# Patient Record
Sex: Male | Born: 1970 | Race: White | Hispanic: No | Marital: Married | Smoking: Never smoker
Health system: Southern US, Community
[De-identification: ages and names within clinical notes are randomized; demographics above are authoritative.]

## PROBLEM LIST (undated history)

## (undated) DIAGNOSIS — L409 Psoriasis, unspecified: Secondary | ICD-10-CM

## (undated) DIAGNOSIS — E785 Hyperlipidemia, unspecified: Secondary | ICD-10-CM

## (undated) DIAGNOSIS — Z800 Family history of malignant neoplasm of digestive organs: Secondary | ICD-10-CM

## (undated) DIAGNOSIS — N390 Urinary tract infection, site not specified: Secondary | ICD-10-CM

## (undated) HISTORY — DX: Hyperlipidemia, unspecified: E78.5

## (undated) HISTORY — DX: Urinary tract infection, site not specified: N39.0

## (undated) HISTORY — PX: COLONOSCOPY: SHX174

## (undated) HISTORY — DX: Psoriasis, unspecified: L40.9

## (undated) HISTORY — DX: Family history of malignant neoplasm of digestive organs: Z80.0

## (undated) HISTORY — PX: WISDOM TOOTH EXTRACTION: SHX21

---

## 2003-04-29 ENCOUNTER — Observation Stay (HOSPITAL_COMMUNITY): Admission: EM | Admit: 2003-04-29 | Discharge: 2003-04-29 | Payer: Self-pay | Admitting: *Deleted

## 2003-05-01 ENCOUNTER — Ambulatory Visit (HOSPITAL_COMMUNITY): Admission: RE | Admit: 2003-05-01 | Discharge: 2003-05-01 | Payer: Self-pay | Admitting: Internal Medicine

## 2003-05-09 ENCOUNTER — Ambulatory Visit (HOSPITAL_COMMUNITY): Admission: RE | Admit: 2003-05-09 | Discharge: 2003-05-09 | Payer: Self-pay | Admitting: Cardiology

## 2003-10-06 ENCOUNTER — Encounter: Admission: RE | Admit: 2003-10-06 | Discharge: 2003-10-06 | Payer: Self-pay | Admitting: Internal Medicine

## 2007-10-20 ENCOUNTER — Ambulatory Visit: Payer: Self-pay | Admitting: Internal Medicine

## 2007-10-22 LAB — CONVERTED CEMR LAB
ALT: 34 units/L (ref 0–53)
AST: 27 units/L (ref 0–37)
Albumin: 4.1 g/dL (ref 3.5–5.2)
Alkaline Phosphatase: 47 units/L (ref 39–117)
BUN: 10 mg/dL (ref 6–23)
Basophils Absolute: 0 10*3/uL (ref 0.0–0.1)
Basophils Relative: 0.6 % (ref 0.0–1.0)
Bilirubin, Direct: 0.1 mg/dL (ref 0.0–0.3)
CO2: 32 meq/L (ref 19–32)
Calcium: 9.2 mg/dL (ref 8.4–10.5)
Chloride: 106 meq/L (ref 96–112)
Cholesterol: 222 mg/dL (ref 0–200)
Creatinine, Ser: 1 mg/dL (ref 0.4–1.5)
Direct LDL: 160.9 mg/dL
Eosinophils Absolute: 0.1 10*3/uL (ref 0.0–0.7)
Eosinophils Relative: 1.2 % (ref 0.0–5.0)
GFR calc Af Amer: 108 mL/min
GFR calc non Af Amer: 89 mL/min
Glucose, Bld: 92 mg/dL (ref 70–99)
HCT: 47.2 % (ref 39.0–52.0)
HDL: 33.1 mg/dL — ABNORMAL LOW (ref >39.0)
Hemoglobin: 16.1 g/dL (ref 13.0–17.0)
Lymphocytes Relative: 19.6 % (ref 12.0–46.0)
MCHC: 34.2 g/dL (ref 30.0–36.0)
MCV: 92.5 fL (ref 78.0–100.0)
Monocytes Absolute: 0.4 10*3/uL (ref 0.1–1.0)
Monocytes Relative: 7.3 % (ref 3.0–12.0)
Neutro Abs: 3.9 10*3/uL (ref 1.4–7.7)
Neutrophils Relative %: 71.3 % (ref 43.0–77.0)
Platelets: 221 10*3/uL (ref 150–400)
Potassium: 3.7 meq/L (ref 3.5–5.1)
RBC: 5.1 M/uL (ref 4.22–5.81)
RDW: 11.6 % (ref 11.5–14.6)
Sodium: 143 meq/L (ref 135–145)
TSH: 1.19 microintl units/mL (ref 0.35–5.50)
Total Bilirubin: 1 mg/dL (ref 0.3–1.2)
Total CHOL/HDL Ratio: 6.7
Total Protein: 6.9 g/dL (ref 6.0–8.3)
Triglycerides: 198 mg/dL — ABNORMAL HIGH (ref 0–149)
VLDL: 40 mg/dL (ref 0–40)
WBC: 5.5 10*3/uL (ref 4.5–10.5)

## 2007-10-26 ENCOUNTER — Telehealth (INDEPENDENT_AMBULATORY_CARE_PROVIDER_SITE_OTHER): Payer: Self-pay | Admitting: *Deleted

## 2007-11-15 ENCOUNTER — Ambulatory Visit: Payer: Self-pay | Admitting: Gastroenterology

## 2007-11-29 ENCOUNTER — Ambulatory Visit: Payer: Self-pay | Admitting: Gastroenterology

## 2007-11-29 ENCOUNTER — Encounter: Payer: Self-pay | Admitting: Gastroenterology

## 2007-11-29 LAB — HM COLONOSCOPY

## 2007-11-30 ENCOUNTER — Encounter: Payer: Self-pay | Admitting: Gastroenterology

## 2008-01-24 ENCOUNTER — Ambulatory Visit: Payer: Self-pay | Admitting: Internal Medicine

## 2008-01-24 ENCOUNTER — Telehealth: Payer: Self-pay | Admitting: Internal Medicine

## 2008-01-24 LAB — CONVERTED CEMR LAB
ALT: 34 U/L
AST: 31 U/L
Albumin: 3.9 g/dL
Alkaline Phosphatase: 52 U/L
Bilirubin, Direct: 0.1 mg/dL
Cholesterol: 199 mg/dL
HDL: 30.4 mg/dL — ABNORMAL LOW
LDL Cholesterol: 142 mg/dL — ABNORMAL HIGH
Total Bilirubin: 0.9 mg/dL
Total CHOL/HDL Ratio: 6.5
Total Protein: 6.4 g/dL
Triglycerides: 134 mg/dL
VLDL: 27 mg/dL

## 2008-01-31 ENCOUNTER — Ambulatory Visit: Payer: Self-pay | Admitting: Internal Medicine

## 2008-01-31 DIAGNOSIS — E785 Hyperlipidemia, unspecified: Secondary | ICD-10-CM

## 2008-03-20 ENCOUNTER — Ambulatory Visit: Payer: Self-pay | Admitting: Internal Medicine

## 2008-03-20 ENCOUNTER — Telehealth: Payer: Self-pay | Admitting: Internal Medicine

## 2008-03-21 LAB — CONVERTED CEMR LAB
Basophils Absolute: 0 10*3/uL (ref 0.0–0.1)
Basophils Relative: 0.2 % (ref 0.0–3.0)
Cholesterol: 159 mg/dL (ref 0–200)
Eosinophils Absolute: 0 10*3/uL (ref 0.0–0.7)
Eosinophils Relative: 1 % (ref 0.0–5.0)
HCT: 45.4 % (ref 39.0–52.0)
HDL: 29.2 mg/dL — ABNORMAL LOW (ref >39.0)
Hemoglobin: 15.9 g/dL (ref 13.0–17.0)
LDL Cholesterol: 106 mg/dL — ABNORMAL HIGH (ref 0–99)
Lymphocytes Relative: 21.1 % (ref 12.0–46.0)
MCHC: 34.9 g/dL (ref 30.0–36.0)
MCV: 93.6 fL (ref 78.0–100.0)
Monocytes Absolute: 0.3 10*3/uL (ref 0.1–1.0)
Monocytes Relative: 6.7 % (ref 3.0–12.0)
Neutro Abs: 3.1 10*3/uL (ref 1.4–7.7)
Neutrophils Relative %: 71 % (ref 43.0–77.0)
Platelets: 217 10*3/uL (ref 150–400)
RBC: 4.85 M/uL (ref 4.22–5.81)
RDW: 12 % (ref 11.5–14.6)
Total CHOL/HDL Ratio: 5.4
Triglycerides: 119 mg/dL (ref 0–149)
VLDL: 24 mg/dL (ref 0–40)
WBC: 4.3 10*3/uL — ABNORMAL LOW (ref 4.5–10.5)

## 2008-03-22 LAB — CONVERTED CEMR LAB
Amphetamine Screen, Ur: NEGATIVE
Barbiturate Quant, Ur: NEGATIVE
Benzodiazepines.: NEGATIVE
Cocaine Metabolites: NEGATIVE
Creatinine,U: 199.5 mg/dL
Ethyl Alcohol: <10 mg/dL (ref <10)
Marijuana Metabolite: NEGATIVE
Methadone: NEGATIVE
Opiate Screen, Urine: NEGATIVE
Phencyclidine (PCP): NEGATIVE
Propoxyphene: NEGATIVE

## 2010-07-06 ENCOUNTER — Encounter: Payer: Self-pay | Admitting: *Deleted

## 2010-09-24 ENCOUNTER — Other Ambulatory Visit: Payer: Self-pay

## 2010-09-30 ENCOUNTER — Other Ambulatory Visit (INDEPENDENT_AMBULATORY_CARE_PROVIDER_SITE_OTHER): Payer: Self-pay | Admitting: Internal Medicine

## 2010-09-30 DIAGNOSIS — Z0000 Encounter for general adult medical examination without abnormal findings: Secondary | ICD-10-CM

## 2010-09-30 LAB — CBC WITH DIFFERENTIAL/PLATELET
Basophils Absolute: 0 10*3/uL (ref 0.0–0.1)
Basophils Relative: 0.5 % (ref 0.0–3.0)
Eosinophils Absolute: 0 10*3/uL (ref 0.0–0.7)
Eosinophils Relative: 0.6 % (ref 0.0–5.0)
HCT: 46.1 % (ref 39.0–52.0)
Hemoglobin: 15.9 g/dL (ref 13.0–17.0)
Lymphocytes Relative: 19 % (ref 12.0–46.0)
Lymphs Abs: 1 10*3/uL (ref 0.7–4.0)
MCHC: 34.4 g/dL (ref 30.0–36.0)
MCV: 93.3 fl (ref 78.0–100.0)
Monocytes Absolute: 0.3 10*3/uL (ref 0.1–1.0)
Monocytes Relative: 6.4 % (ref 3.0–12.0)
Neutro Abs: 3.9 10*3/uL (ref 1.4–7.7)
Neutrophils Relative %: 73.5 % (ref 43.0–77.0)
Platelets: 219 10*3/uL (ref 150.0–400.0)
RBC: 4.94 Mil/uL (ref 4.22–5.81)
RDW: 12.5 % (ref 11.5–14.6)
WBC: 5.3 10*3/uL (ref 4.5–10.5)

## 2010-09-30 LAB — BASIC METABOLIC PANEL
BUN: 12 mg/dL (ref 6–23)
CO2: 29 mEq/L (ref 19–32)
Calcium: 9.2 mg/dL (ref 8.4–10.5)
Chloride: 102 mEq/L (ref 96–112)
Creatinine, Ser: 1.1 mg/dL (ref 0.4–1.5)
GFR: 82.35 mL/min (ref >60.00)
Glucose, Bld: 81 mg/dL (ref 70–99)
Potassium: 4 mEq/L (ref 3.5–5.1)
Sodium: 139 mEq/L (ref 135–145)

## 2010-09-30 LAB — LIPID PANEL
Cholesterol: 178 mg/dL (ref 0–200)
HDL: 40.9 mg/dL (ref >39.00)
LDL Cholesterol: 115 mg/dL — ABNORMAL HIGH (ref 0–99)
Total CHOL/HDL Ratio: 4
Triglycerides: 113 mg/dL (ref 0.0–149.0)
VLDL: 22.6 mg/dL (ref 0.0–40.0)

## 2010-09-30 LAB — POCT URINALYSIS DIPSTICK
Bilirubin, UA: NEGATIVE
Blood, UA: NEGATIVE
Glucose, UA: NEGATIVE
Ketones, UA: NEGATIVE
Leukocytes, UA: NEGATIVE
Nitrite, UA: NEGATIVE
Protein, UA: NEGATIVE
Spec Grav, UA: 1.025
Urobilinogen, UA: 0.2
pH, UA: 6.5

## 2010-09-30 LAB — HEPATIC FUNCTION PANEL
ALT: 26 U/L (ref 0–53)
AST: 21 U/L (ref 0–37)
Albumin: 3.9 g/dL (ref 3.5–5.2)
Alkaline Phosphatase: 48 U/L (ref 39–117)
Bilirubin, Direct: 0.1 mg/dL (ref 0.0–0.3)
Total Bilirubin: 0.8 mg/dL (ref 0.3–1.2)
Total Protein: 6.6 g/dL (ref 6.0–8.3)

## 2010-09-30 LAB — TSH: TSH: 0.74 u[IU]/mL (ref 0.35–5.50)

## 2010-10-04 ENCOUNTER — Encounter: Payer: Self-pay | Admitting: Internal Medicine

## 2010-10-07 ENCOUNTER — Ambulatory Visit (INDEPENDENT_AMBULATORY_CARE_PROVIDER_SITE_OTHER): Payer: 59 | Admitting: Internal Medicine

## 2010-10-07 ENCOUNTER — Encounter: Payer: Self-pay | Admitting: Internal Medicine

## 2010-10-07 VITALS — BP 122/80 | HR 84 | Temp 98.1°F | Ht 73.0 in | Wt 229.0 lb

## 2010-10-07 DIAGNOSIS — Z23 Encounter for immunization: Secondary | ICD-10-CM

## 2010-10-07 DIAGNOSIS — Z0000 Encounter for general adult medical examination without abnormal findings: Secondary | ICD-10-CM

## 2010-10-07 NOTE — Progress Notes (Signed)
  Subjective:    Patient ID: Cody Henderson, male    DOB: 03/12/1971, 40 y.o.   MRN: [id number]  HPI  cpx Past Medical History  Diagnosis Date  . Hyperlipidemia    No past surgical history on file.  reports that he has never smoked. He does not have any smokeless tobacco history on file. He reports that he drinks alcohol. His drug history not on file. family history includes Arthritis in his mother and Cancer in his maternal grandmother and mother. No Known Allergies    Review of Systems  patient denies chest pain, shortness of breath, orthopnea. Denies lower extremity edema, abdominal pain, change in appetite, change in bowel movements. Patient denies rashes, musculoskeletal complaints. No other specific complaints in a complete review of systems.      Objective:   Physical Exam  well-developed well-nourished male in no acute distress. HEENT exam atraumatic, normocephalic, neck supple without jugular venous distention. Chest clear to auscultation cardiac exam S1-S2 are regular. Abdominal exam overweight with bowel sounds, soft and nontender. Extremities no edema. Neurologic exam is alert with a normal gait.       Assessment & Plan:  Well visit Health maint utd escept will give tdap today Advised regular exercise and weight loss

## 2010-11-01 NOTE — Procedures (Signed)
NAME:  Cody Henderson, Cody Henderson                      ACCOUNT NO.:  [id number]   MEDICAL RECORD NO.:  [id number]                   PATIENT TYPE:  OUT   LOCATION:  RAD                                  FACILITY:  [facility]   PHYSICIAN:  Vida Roller, M.D.                DATE OF BIRTH:  1970/07/25   DATE OF PROCEDURE:  05/09/2003  DATE OF DISCHARGE:                                  ECHOCARDIOGRAM   TAPE NUMBER:  SE - 402.   TAPE COUNT:  2027 - 2204.   INDICATIONS FOR PROCEDURE:  This is a 40 year old male with chest pain.   DETAILS OF PROCEDURE:  In the resting state the patient had  echocardiographic images obtained of his heart in the apical 4, apical 2,  parasternal long and parasternal short axis views. These were kept in cine  loop format. The patient then exercised under Bruce protocol and when he had  attained at least 85% of his maximum predicted heart rate for his age, he  was immediately taken back to the echocardiographic suite where the same  images were obtained. These were used for comparison, stress versus rest.   RESULTS:  The patient exercised 10 minutes of a Bruce protocol, attaining 10  minutes of exercise. His heart rate increased from 64 beats per minute to  180 blood pressure, which is 97% of his maximum predicted heart rate for his  age. His blood pressure went from 126/78 to 192/90, giving dual product of  34.5. During that time, he had no symptoms. No arrhythmia. No STT wave  changes concerning for ischemia. There were no ischemic changes seen on the  electrocardiographic images in recovery as well.   ECHOCARDIOGRAPHIC IMAGES:  The baseline echocardiogram shows normal left  ventricular function with no evidence of wall motion abnormalities. Stress  images reveal appropriate augmentation of left ventricular systolic function  with no obvious stress induced wall motion abnormalities. Overall thickening  was normal throughout all myocardial segments.   INTERPRETATION:   This is an extremely low risk scan in a patient with no  known coronary artery disease. Clinical correlation is advised but the  likelihood of significant obstructive coronary disease is extremely low.      ___________________________________________                                            Vida Roller, M.D.   JH/MEDQ  D:  05/09/2003  T:  05/09/2003  Job:  [id number]

## 2010-11-01 NOTE — H&P (Signed)
NAME:  Cody Henderson, HOUSAND                      ACCOUNT NO.:  [id number]   MEDICAL RECORD NO.:  [id number]                   PATIENT TYPE:  OBV   LOCATION:  A222                                 FACILITY:  [facility]   PHYSICIAN:  Vania Rea, M.D.              DATE OF BIRTH:  [birth year]   DATE OF ADMISSION:  04/29/2003  DATE OF DISCHARGE:                                HISTORY & PHYSICAL   CHIEF COMPLAINT:  Sudden onset of chest pain and syncope early this morning.   HISTORY OF PRESENTING ILLNESS:  This is a healthy, Caucasian man with no  significant past medical history who has been under a lot of stress at work.  He had 2 episodes of driving, long distance, for 7 hours.  The first episode  5 days ago and then another 7-hour episode 2 days ago and he has not been  getting enough sleep.  The night before admission he fell asleep on the  couch and then woke at about midnight to go to bed. As he laid down he had  the sudden onset of left chest pain, squeezing in character radiating to the  left shoulder.  It was not relieved by stretching the chest and moving the  arms.  He got up and went to the bathroom where he was overcome with nausea,  sweating, and a tight cramping feeling in the left chest. He collapsed onto  the floor where he wife found him, making clawing movements on the floor and  apparently not responding to her commands.  There was no incontinence of  urine, no biting of his tongue.  He eventually recovered and was brought to  the emergency room.  There was associated shortness of breath, but there was  no fever and no vomiting.  On arrival in the emergency room the pain had  subsided spontaneously and he has been feeling completely normal since then.   PAST MEDICAL HISTORY:  His past medical history is completely negative.   ALLERGIES:  He has no known allergies.   MEDICATIONS:  He is on no medications at all.  No over-the-counter  medications.   SOCIAL HISTORY:  He  does not smoke, nor drink, nor take drugs.  He lives  with his wife.  He has 2 children ages 2 and 5.  There is no family history  of heart disease.  There is a remote family history of diabetes.   REVIEW OF SYSTEMS:  His review of systems is negative except for pain in the  left testicle on and off for the past 2 weeks and pain in the left groin on  and off for the past 2 weeks.   PHYSICAL EXAMINATION:  GENERAL:  He is a mildly obese, young, Caucasian man,  lying in bed, comfortable in no distress.  VITAL SIGNS:  Temperature 97.3, respirations 18, pulse 73, blood pressure  132/66.  HEENT:  Pink.  He is  anicteric.  Pupils are equal and reactive to light.  NECK:  There is no jugular venous distention.  CHEST:  The chest is clear to auscultation bilaterally.  CARDIOVASCULAR:  Regular rhythm, no murmurs.  ABDOMEN:  Obese, soft and nontender.  Normal abdominal bowel sounds.  EXTREMITIES:  No edema, 3+ pulses throughout.  There is no calf tenderness,  no groin tenderness.  GENITALIA:  Both right and left testicles are normal. There are no masses.  CENTRAL NERVOUS SYSTEM:  He is alert and oriented x3, no focal deficit.   Two sets of cardiac enzymes are completed.  CK/MB is 1.5, and the second one  was 1.5.  The troponin:  The first was 0.01, the second one less then 0.01.  Two EKGs are both normal with no evidence of ischemia.   ASSESSMENT:  Atypical chest pain, rule out acute anxiety attack.  In view of  the severe symptoms with syncope it is unlikely to be an myocardial  infarction with 2 sets of enzymes negative.  However, because of the history  of episodes of prolonged driving and sudden syncope we need to rule out a  deep venous thrombosis with pulmonary embolism.  So, the plan is to hydrate  him, do a Doppler ultrasound of his legs if available and a CT scan of his  chest.     ___________________________________________                                         Vania Rea,  M.D.   LC/MEDQ  D:  04/29/2003  T:  04/29/2003  Job:  [id number]

## 2010-11-01 NOTE — Procedures (Signed)
NAME:  Cody Henderson, Cody Henderson NO.:  [id number]   MEDICAL RECORD NO.:  [id number]                   PATIENT TYPE:  OUT   LOCATION:  RAD                                  FACILITY:  [facility]   PHYSICIAN:  Vida Roller, M.D.                DATE OF BIRTH:  1971-05-15   DATE OF PROCEDURE:  05/09/2003  DATE OF DISCHARGE:                                    STRESS TEST   INDICATION:  [name] is a 40 year old male with no past medical history  who presented with an episode of atypical chest discomfort and associated  syncope.  He was evaluated in the emergency room initially with negative  cardiac enzymes x1 and negative chest CT for pulmonary embolus.  His cardiac  risk factors include male sex and hypertriglyceridemia.   BASELINE DATA:  EKG reveals sinus rhythm at 66 beats per minute, nonspecific  ST abnormalities, blood pressure is 126/78.  Patient exercised for a total  of 9 minutes 52 seconds to Bruce protocol stage 4.  Maximum heart rate was  180 beats per minute which is 97% of predicted maximum.  Maximum blood  pressure is 192/90 which resolved down to 138/72 in recovery.  EKG revealed  no arrhythmias and no ischemic changes.  The patient denied any chest  discomfort or shortness of breath with exercise.  Test was stopped secondary  to fatigue.   Final results and stress images are pending MD review.     ________________________________________  ___________________________________________  Jae Dire, P.A. [facility]                      Vida Roller, M.D.   AB/MEDQ  D:  05/09/2003  T:  05/09/2003  Job:  [id number]

## 2010-11-01 NOTE — Discharge Summary (Signed)
   NAME:  Cody Henderson, Cody Henderson                      ACCOUNT NO.:  [id number]   MEDICAL RECORD NO.:  [id number]                   PATIENT TYPE:  OBV   LOCATION:  A222                                 FACILITY:  [facility]   PHYSICIAN:  Vania Rea, M.D.              DATE OF BIRTH:  [birth year]   DATE OF ADMISSION:  04/29/2003  DATE OF DISCHARGE:  04/29/2003                                 DISCHARGE SUMMARY   DISCHARGE DIAGNOSES:  1. Atypical chest pain, myocardial infarction ruled out.  2. Pulmonary embolism ruled out.  3. Transient stress disorder.   DISPOSITION:  Discharged to home.   CONDITION ON DISCHARGE:  Stable.   DISCHARGE MEDICATIONS:  Aspirin 81 mg daily.   HOSPITAL COURSE:  The patient was admitted with atypical chest pain for  observation. Please see history and physical of April 29, 2003. The  patient had acute squeezing chest pain and syncope. Came to the emergency  room. Two sets of cardiac enzymes were negative. EKG was persistently  normal. Because of the history of syncope, a CT scan of the chest was done,  which was also negative. Because the patient had a history of 2 episodes of  driving continuously for 7 hours, a deep vein thrombosis still cannot be  ruled out despite CT of the chest being negative. The patient has been  recommended for bilateral lower extremity Doppler's early next week.  However, the likelihood is that this is purely as a result of the stressful  situations that the patient has been undergoing for the past week. The  patient is nevertheless, advised to have bilateral lower extremity Doppler's  and a treadmill stress test as soon as possible. Because the patient was  subjective to a contrast study, the patient has been recommended to have a  chem 7 in 2 days and will present that to his primary care physician.   FOLLOW UP:  1. With Dr. Renard Matter, who has been selected as his primary care physician.     Dr. Renard Matter to followup with the  results of the chem 7 and bilateral lower extremity Doppler's.  2. The patient is also to followup with [facility] for a stress     test.   SPECIAL INSTRUCTIONS:  The patient is to return to the emergency room for  worsening chest pain or syncope.     ___________________________________________                                         Vania Rea, M.D.   LC/MEDQ  D:  04/29/2003  T:  04/29/2003  Job:  [id number]   cc:   Angus G. Renard Matter, M.D.  [address]  [place]  [place] [address]  Fax: [phone]

## 2012-03-09 ENCOUNTER — Encounter: Payer: Self-pay | Admitting: Internal Medicine

## 2012-03-09 ENCOUNTER — Ambulatory Visit (INDEPENDENT_AMBULATORY_CARE_PROVIDER_SITE_OTHER): Payer: 59 | Admitting: Internal Medicine

## 2012-03-09 VITALS — BP 116/82 | Temp 98.1°F | Wt 234.0 lb

## 2012-03-09 DIAGNOSIS — N390 Urinary tract infection, site not specified: Secondary | ICD-10-CM

## 2012-03-09 LAB — POCT URINALYSIS DIPSTICK
Glucose, UA: NEGATIVE
Nitrite, UA: NEGATIVE
Spec Grav, UA: >=1.03
Urobilinogen, UA: 1
pH, UA: 5.5

## 2012-03-09 MED ORDER — CIPROFLOXACIN HCL 500 MG PO TABS: 500.0000 mg | ORAL_TABLET | Freq: Two times a day (BID) | ORAL | Status: DC

## 2012-03-09 NOTE — Assessment & Plan Note (Signed)
41 year old white male with signs and symptoms of urinary tract infection. Rule out kidney stones as trigger for his symptoms. Treat with Cipro 500 mg twice a daily for 7 days. Obtain renal ultrasound.  Patient advised to call office if symptoms persist or worsen.

## 2012-03-09 NOTE — Progress Notes (Signed)
  Subjective:    Patient ID: Cody Henderson, male    DOB: [birth year], 41 y.o.   MRN: [id number]  HPI  41 year old white male complains of urinary urgency and frequency with low-grade fever over the last 48 hours.  His symptoms started on Sunday when he felt achy all over. Following day patient noticed urine was much darker and had an odor. He over last 24 hours he has felt feverish and tired. He reports previous history of prostatitis.  He denies history of BPH. Over last several days his urine stream is been somewhat weak.    Review of Systems Negative for history of kidney stones  Past Medical History  Diagnosis Date  . Hyperlipidemia     History   Social History  . Marital Status: Married    Spouse Name: N/A    Number of Children: N/A  . Years of Education: N/A   Occupational History  . buyer     [employer]   Social History Main Topics  . Smoking status: Never Smoker   . Smokeless tobacco: Not on file  . Alcohol Use: Yes  . Drug Use:   . Sexually Active:    Other Topics Concern  . Not on file   Social History Narrative  . No narrative on file    No past surgical history on file.  Family History  Problem Relation Age of Onset  . Arthritis Mother   . Cancer Mother     colon  . Cancer Maternal Grandmother     colon and cancer    No Known Allergies  No current outpatient prescriptions on file prior to visit.    BP 116/82  Temp 98.1 F (36.7 C) (Oral)  Wt 234 lb (106.142 kg)  Urinalysis positive for 3+ blood, 2+ protein and leukocytes     Objective:   Physical Exam  Constitutional: He is oriented to person, place, and time. He appears well-developed and well-nourished.  HENT:  Head: Normocephalic and atraumatic.  Right Ear: External ear normal.  Left Ear: External ear normal.  Mouth/Throat: Oropharynx is clear and moist.  Cardiovascular: Normal rate and regular rhythm.   Murmur heard. Pulmonary/Chest: Effort normal and breath sounds  normal.  Abdominal: Soft. Bowel sounds are normal. He exhibits no mass. There is no tenderness.       No flank tenderness  Genitourinary: Rectum normal and prostate normal. Guaiac negative stool.       Prostate gland is slightly enlarged but nontender. No asymmetry or prostate nodules  Neurological: He is alert and oriented to person, place, and time.  Psychiatric: He has a normal mood and affect. His behavior is normal.          Assessment & Plan:

## 2012-03-09 NOTE — Patient Instructions (Addendum)
Please complete the following lab tests before your next follow up appointment: BMET, LFTs, FLP, CBCD, TSH - V70

## 2012-03-12 ENCOUNTER — Ambulatory Visit
Admission: RE | Admit: 2012-03-12 | Discharge: 2012-03-12 | Disposition: A | Discharge status: Home / Self Care | Payer: 59 | Source: Ambulatory Visit | Attending: Internal Medicine | Admitting: Internal Medicine

## 2012-03-12 DIAGNOSIS — N390 Urinary tract infection, site not specified: Secondary | ICD-10-CM

## 2012-03-13 LAB — URINE CULTURE: Colony Count: >=100000

## 2012-03-26 ENCOUNTER — Other Ambulatory Visit (INDEPENDENT_AMBULATORY_CARE_PROVIDER_SITE_OTHER): Payer: 59

## 2012-03-26 DIAGNOSIS — N390 Urinary tract infection, site not specified: Secondary | ICD-10-CM

## 2012-03-26 LAB — POCT URINALYSIS DIPSTICK
Bilirubin, UA: NEGATIVE
Blood, UA: NEGATIVE
Glucose, UA: NEGATIVE
Ketones, UA: NEGATIVE
Leukocytes, UA: NEGATIVE
Nitrite, UA: NEGATIVE
Protein, UA: NEGATIVE
Spec Grav, UA: 1.025
Urobilinogen, UA: NEGATIVE
pH, UA: 7

## 2012-06-11 ENCOUNTER — Ambulatory Visit (INDEPENDENT_AMBULATORY_CARE_PROVIDER_SITE_OTHER): Payer: 59 | Admitting: Internal Medicine

## 2012-06-11 ENCOUNTER — Encounter: Payer: Self-pay | Admitting: Internal Medicine

## 2012-06-11 VITALS — BP 150/80 | HR 88 | Temp 98.3°F | Resp 18 | Wt 241.0 lb

## 2012-06-11 DIAGNOSIS — N390 Urinary tract infection, site not specified: Secondary | ICD-10-CM

## 2012-06-11 LAB — POCT URINALYSIS DIPSTICK
Bilirubin, UA: NEGATIVE
Glucose, UA: NEGATIVE
Ketones, UA: NEGATIVE
Nitrite, UA: POSITIVE
Protein, UA: 30
Spec Grav, UA: 1.025
Urobilinogen, UA: 0.2
pH, UA: 5

## 2012-06-11 MED ORDER — CIPROFLOXACIN HCL 500 MG PO TABS: 500.0000 mg | ORAL_TABLET | Freq: Two times a day (BID) | ORAL | Status: DC

## 2012-06-11 NOTE — Patient Instructions (Signed)
Drink as much fluid as you  can tolerate over the next few days  Take your antibiotic as prescribed until ALL of it is gone, but stop if you develop a rash, swelling, or any side effects of the medication.  Contact our office as soon as possible if  there are side effects of the medication.   

## 2012-06-11 NOTE — Progress Notes (Signed)
  Subjective:    Patient ID: Cody Henderson, male    DOB: [birth year], 41 y.o.   MRN: [id number]  HPI  41 year old patient who presents with a one-day history of urinary frequency some urgency low-grade fever and a general sense of unwellness. He was seen here 3 months ago and diagnosed and treated for a UTI. Urine culture revealed Escherichia coli which was pansensitive. His symptoms are the same. No recent antibiotic use.  Past Medical History  Diagnosis Date  . Hyperlipidemia     History   Social History  . Marital Status: Married    Spouse Name: N/A    Number of Children: N/A  . Years of Education: N/A   Occupational History  . buyer     [employer]   Social History Main Topics  . Smoking status: Never Smoker   . Smokeless tobacco: Not on file  . Alcohol Use: Yes  . Drug Use:   . Sexually Active:    Other Topics Concern  . Not on file   Social History Narrative  . No narrative on file    No past surgical history on file.  Family History  Problem Relation Age of Onset  . Arthritis Mother   . Cancer Mother     colon  . Cancer Maternal Grandmother     colon and cancer    No Known Allergies  Current Outpatient Prescriptions on File Prior to Visit  Medication Sig Dispense Refill  . ciprofloxacin (CIPRO) 500 MG tablet Take 1 tablet (500 mg total) by mouth 2 (two) times daily.  14 tablet  0    BP 150/80  Pulse 88  Temp 98.3 F (36.8 C) (Oral)  Resp 18  Wt 241 lb (109.317 kg)  SpO2 96%      Review of Systems  Constitutional: Negative for fever, chills, appetite change and fatigue.  HENT: Negative for hearing loss, ear pain, congestion, sore throat, trouble swallowing, neck stiffness, dental problem, voice change and tinnitus.   Eyes: Negative for pain, discharge and visual disturbance.  Respiratory: Negative for cough, chest tightness, wheezing and stridor.   Cardiovascular: Negative for chest pain, palpitations and leg swelling.    Gastrointestinal: Negative for nausea, vomiting, abdominal pain, diarrhea, constipation, blood in stool and abdominal distention.  Genitourinary: Positive for urgency and frequency. Negative for hematuria, flank pain, discharge, difficulty urinating and genital sores.  Musculoskeletal: Negative for myalgias, back pain, joint swelling, arthralgias and gait problem.  Skin: Negative for rash.  Neurological: Negative for dizziness, syncope, speech difficulty, weakness, numbness and headaches.  Hematological: Negative for adenopathy. Does not bruise/bleed easily.  Psychiatric/Behavioral: Negative for behavioral problems and dysphoric mood. The patient is not nervous/anxious.        Objective:   Physical Exam  Constitutional: He appears well-developed and well-nourished. No distress.          Assessment & Plan:   Recurrent urinary tract infection. Patient had a normal renal ultrasound 3 months ago. We'll consider urological referral if he has anymore recurrent UTIs 3 Will retreat with Cipro for 7 days.

## 2012-11-23 ENCOUNTER — Encounter: Payer: Self-pay | Admitting: Gastroenterology

## 2013-05-02 ENCOUNTER — Encounter: Payer: Self-pay | Admitting: Gastroenterology

## 2013-08-31 ENCOUNTER — Encounter: Payer: Self-pay | Admitting: Gastroenterology

## 2013-10-17 ENCOUNTER — Encounter: Payer: 59 | Admitting: Gastroenterology

## 2013-12-13 ENCOUNTER — Ambulatory Visit (AMBULATORY_SURGERY_CENTER): Payer: Self-pay | Admitting: *Deleted

## 2013-12-13 VITALS — Ht 73.0 in | Wt 236.2 lb

## 2013-12-13 DIAGNOSIS — Z86010 Personal history of colonic polyps: Secondary | ICD-10-CM

## 2013-12-13 MED ORDER — MOVIPREP 100 G PO SOLR: ORAL | Status: DC

## 2013-12-13 NOTE — Progress Notes (Signed)
No allergies to eggs or soy. No problems with sedation.  Pt given Emmi instructions for colonoscopy  No oxygen use  No diet drug use

## 2013-12-15 ENCOUNTER — Encounter: Payer: Self-pay | Admitting: Gastroenterology

## 2013-12-27 ENCOUNTER — Encounter: Payer: Self-pay | Admitting: Gastroenterology

## 2013-12-27 ENCOUNTER — Ambulatory Visit (AMBULATORY_SURGERY_CENTER): Payer: 59 | Admitting: Gastroenterology

## 2013-12-27 VITALS — BP 110/65 | HR 64 | Temp 97.2°F | Resp 18 | Ht 73.0 in | Wt 236.0 lb

## 2013-12-27 DIAGNOSIS — D126 Benign neoplasm of colon, unspecified: Secondary | ICD-10-CM

## 2013-12-27 DIAGNOSIS — Z1211 Encounter for screening for malignant neoplasm of colon: Secondary | ICD-10-CM

## 2013-12-27 DIAGNOSIS — Z86010 Personal history of colonic polyps: Secondary | ICD-10-CM

## 2013-12-27 DIAGNOSIS — Z800 Family history of malignant neoplasm of digestive organs: Secondary | ICD-10-CM

## 2013-12-27 MED ORDER — SODIUM CHLORIDE 0.9 % IV SOLN: 500.0000 mL | INTRAVENOUS | Status: DC

## 2013-12-27 NOTE — Op Note (Signed)
[facility]  [address]. [place], [address]   COLONOSCOPY PROCEDURE REPORT  PATIENT: Cody, Henderson  MR#: [id number] BIRTHDATE: 1970-09-03 , 43  yrs. old GENDER: Male ENDOSCOPIST: Milus Banister, MD PROCEDURE DATE:  12/27/2013 PROCEDURE:   Colonoscopy with snare polypectomy First Screening Colonoscopy - Avg.  risk and is 50 yrs.  old or older - No.  Prior Negative Screening - Now for repeat screening. Above average risk  History of Adenoma - Now for follow-up colonoscopy & has been > or = to 3 yrs.  N/A  Polyps Removed Today? Yes. ASA CLASS:   Class II INDICATIONS:elevated risk screening; mother had colon cancer in her 64's; colonoscopy 2009 found hyperplastic polyps only MEDICATIONS: MAC sedation, administered by CRNA and Propofol (Diprivan) 240 mg IV  DESCRIPTION OF PROCEDURE:   After the risks benefits and alternatives of the procedure were thoroughly explained, informed consent was obtained.  A digital rectal exam revealed no abnormalities of the rectum.   The LB [id number]  endoscope was introduced through the anus and advanced to the cecum, which was identified by both the appendix and ileocecal valve. No adverse events experienced.   The quality of the prep was excellent.  The instrument was then slowly withdrawn as the colon was fully examined.   COLON FINDINGS: One polyp was found, removed and sent to pathology. This was sessile, 77mm across, located in sigmoid segment, removed with cold snare.  The examination was otherwise normal. Retroflexed views revealed no abnormalities. The time to cecum=1 minutes 13 seconds.  Withdrawal time=6 minutes 33 seconds.  The scope was withdrawn and the procedure completed. COMPLICATIONS: There were no complications.  ENDOSCOPIC IMPRESSION: One polyp was found, removed and sent to pathology. The examination was otherwise normal.  RECOMMENDATIONS: Given your significant family history of colon  cancer, you should have a repeat colonoscopy in 5 years even if the polyp removed today is NOT precancerous.  You will receive a letter within 1-2 weeks with the results of your biopsy as well as final recommendations.  Please call my office if you have not received a letter after 3 weeks.   eSigned:  Milus Banister, MD 12/27/2013 8:17 AM   cc:  Phoebe Sharps, MD

## 2013-12-27 NOTE — Progress Notes (Signed)
Called to room to assist during endoscopic procedure.  Patient ID and intended procedure confirmed with present staff. Received instructions for my participation in the procedure from the performing physician.  

## 2013-12-27 NOTE — Progress Notes (Signed)
Report to PACU, RN, vss, BBS= Clear.  

## 2013-12-27 NOTE — Patient Instructions (Signed)
Discharge instructions given with verbal understanding. Handout on polyps. Resume previous medications. YOU HAD AN ENDOSCOPIC PROCEDURE TODAY AT THE [facility]: Refer to the procedure report that was given to you for any specific questions about what was found during the examination.  If the procedure report does not answer your questions, please call your gastroenterologist to clarify.  If you requested that your care partner not be given the details of your procedure findings, then the procedure report has been included in a sealed envelope for you to review at your convenience later.  YOU SHOULD EXPECT: Some feelings of bloating in the abdomen. Passage of more gas than usual.  Walking can help get rid of the air that was put into your GI tract during the procedure and reduce the bloating. If you had a lower endoscopy (such as a colonoscopy or flexible sigmoidoscopy) you may notice spotting of blood in your stool or on the toilet paper. If you underwent a bowel prep for your procedure, then you may not have a normal bowel movement for a few days.  DIET: Your first meal following the procedure should be a light meal and then it is ok to progress to your normal diet.  A half-sandwich or bowl of soup is an example of a good first meal.  Heavy or fried foods are harder to digest and may make you feel nauseous or bloated.  Likewise meals heavy in dairy and vegetables can cause extra gas to form and this can also increase the bloating.  Drink plenty of fluids but you should avoid alcoholic beverages for 24 hours.  ACTIVITY: Your care partner should take you home directly after the procedure.  You should plan to take it easy, moving slowly for the rest of the day.  You can resume normal activity the day after the procedure however you should NOT DRIVE or use heavy machinery for 24 hours (because of the sedation medicines used during the test).    SYMPTOMS TO REPORT IMMEDIATELY: A  gastroenterologist can be reached at any hour.  During normal business hours, 8:30 AM to 5:00 PM Monday through Friday, call [phone].  After hours and on weekends, please call the GI answering service at [phone] who will take a message and have the physician on call contact you.   Following lower endoscopy (colonoscopy or flexible sigmoidoscopy):  Excessive amounts of blood in the stool  Significant tenderness or worsening of abdominal pains  Swelling of the abdomen that is new, acute  Fever of 100F or higher  FOLLOW UP: If any biopsies were taken you will be contacted by phone or by letter within the next 1-3 weeks.  Call your gastroenterologist if you have not heard about the biopsies in 3 weeks.  Our staff will call the home number listed on your records the next business day following your procedure to check on you and address any questions or concerns that you may have at that time regarding the information given to you following your procedure. This is a courtesy call and so if there is no answer at the home number and we have not heard from you through the emergency physician on call, we will assume that you have returned to your regular daily activities without incident.  SIGNATURES/CONFIDENTIALITY: You and/or your care partner have signed paperwork which will be entered into your electronic medical record.  These signatures attest to the fact that that the information above on your After Visit Summary has been   reviewed and is understood.  Full responsibility of the confidentiality of this discharge information lies with you and/or your care-partner. 

## 2013-12-28 ENCOUNTER — Telehealth: Payer: Self-pay | Admitting: *Deleted

## 2013-12-28 NOTE — Telephone Encounter (Signed)
Left message that we called for f/u 

## 2014-01-09 ENCOUNTER — Encounter: Payer: Self-pay | Admitting: Gastroenterology

## 2014-02-08 ENCOUNTER — Encounter: Payer: Self-pay | Admitting: Gastroenterology

## 2014-11-20 ENCOUNTER — Encounter: Payer: Self-pay | Admitting: Internal Medicine

## 2014-11-20 ENCOUNTER — Ambulatory Visit (INDEPENDENT_AMBULATORY_CARE_PROVIDER_SITE_OTHER): Payer: 59 | Admitting: Internal Medicine

## 2014-11-20 VITALS — BP 120/76 | HR 65 | Temp 98.5°F | Resp 20 | Ht 73.0 in | Wt 238.0 lb

## 2014-11-20 DIAGNOSIS — R350 Frequency of micturition: Secondary | ICD-10-CM

## 2014-11-20 DIAGNOSIS — R319 Hematuria, unspecified: Secondary | ICD-10-CM | POA: Diagnosis not present

## 2014-11-20 MED ORDER — CIPROFLOXACIN HCL 500 MG PO TABS: 500.0000 mg | ORAL_TABLET | Freq: Two times a day (BID) | ORAL | Status: DC

## 2014-11-20 NOTE — Progress Notes (Signed)
Pre visit review using our clinic review tool, if applicable. No additional management support is needed unless otherwise documented below in the visit note. 

## 2014-11-20 NOTE — Patient Instructions (Signed)
Drink as much fluid as you  can tolerate over the next few days  Take your antibiotic as prescribed until ALL of it is gone, but stop if you develop a rash, swelling, or any side effects of the medication.  Contact our office as soon as possible if  there are side effects of the medication.   

## 2014-11-20 NOTE — Progress Notes (Signed)
   Subjective:    Patient ID: Cody Henderson, male    DOB: [birth year], 44 y.o.   MRN: [id number]  HPI 44 year old patient who has had a history of urinary tract infections.  For the past few days she has had some urinary frequency, urgency and a single episode of hematuria.  He has been referred to urology in the past.  He has had no recent infections. He did have a colonoscopy done last year due to strong family history of colon cancer  Past Medical History  Diagnosis Date  . Hyperlipidemia     History   Social History  . Marital Status: Married    Spouse Name: N/A  . Number of Children: N/A  . Years of Education: N/A   Occupational History  . buyer     [employer]   Social History Main Topics  . Smoking status: Never Smoker   . Smokeless tobacco: Never Used  . Alcohol Use: Yes     Comment: rare  . Drug Use: No  . Sexual Activity: Not on file   Other Topics Concern  . Not on file   Social History Narrative    Past Surgical History  Procedure Laterality Date  . Wisdom tooth extraction      Family History  Problem Relation Age of Onset  . Arthritis Mother   . Cancer Mother     colon  . Colon cancer Mother 60  . Cancer Maternal Grandmother     colon and cancer  . Colon cancer Maternal Grandmother     not sure age of onset    No Known Allergies  No current outpatient prescriptions on file prior to visit.   No current facility-administered medications on file prior to visit.    BP 120/76 mmHg  Pulse 65  Temp(Src) 98.5 F (36.9 C) (Oral)  Resp 20  Ht 6\' 1"  (1.854 m)  Wt 238 lb (107.956 kg)  BMI 31.41 kg/m2  SpO2 97%      Review of Systems  Constitutional: Negative for fever, chills, appetite change and fatigue.  HENT: Negative for congestion, dental problem, ear pain, hearing loss, sore throat, tinnitus, trouble swallowing and voice change.   Eyes: Negative for pain, discharge and visual disturbance.  Respiratory: Negative for cough,  chest tightness, wheezing and stridor.   Cardiovascular: Negative for chest pain, palpitations and leg swelling.  Gastrointestinal: Negative for nausea, vomiting, abdominal pain, diarrhea, constipation, blood in stool and abdominal distention.  Genitourinary: Positive for dysuria, urgency, frequency and difficulty urinating. Negative for hematuria, flank pain, decreased urine volume, discharge, scrotal swelling, genital sores and testicular pain.  Musculoskeletal: Negative for myalgias, back pain, joint swelling, arthralgias, gait problem and neck stiffness.  Skin: Negative for rash.  Neurological: Negative for dizziness, syncope, speech difficulty, weakness, numbness and headaches.  Hematological: Negative for adenopathy. Does not bruise/bleed easily.  Psychiatric/Behavioral: Negative for behavioral problems and dysphoric mood. The patient is not nervous/anxious.        Objective:   Physical Exam  Constitutional: He appears well-developed and well-nourished. No distress.          Assessment & Plan:   Urinary tract infection.  Will treat with Cipro. Patient has had urological evaluation.  Unclear whether he has had cystoscopy, but has had a ultrasound.  If recurrent symptoms or hematuria.  Will refer  Strong family history colon cancer, status post colonoscopy July 2015 Suggest annual exam at his convenience

## 2015-06-17 DIAGNOSIS — Z87442 Personal history of urinary calculi: Secondary | ICD-10-CM

## 2015-06-17 HISTORY — DX: Personal history of urinary calculi: Z87.442

## 2015-07-13 ENCOUNTER — Emergency Department (HOSPITAL_COMMUNITY): Payer: 59

## 2015-07-13 ENCOUNTER — Encounter (HOSPITAL_COMMUNITY): Payer: Self-pay

## 2015-07-13 ENCOUNTER — Emergency Department (HOSPITAL_COMMUNITY)
Admission: EM | Admit: 2015-07-13 | Discharge: 2015-07-13 | Disposition: A | Discharge status: Home / Self Care | Payer: 59 | Attending: Emergency Medicine | Admitting: Emergency Medicine

## 2015-07-13 DIAGNOSIS — R61 Generalized hyperhidrosis: Secondary | ICD-10-CM | POA: Diagnosis not present

## 2015-07-13 DIAGNOSIS — R109 Unspecified abdominal pain: Secondary | ICD-10-CM

## 2015-07-13 DIAGNOSIS — Z8639 Personal history of other endocrine, nutritional and metabolic disease: Secondary | ICD-10-CM | POA: Insufficient documentation

## 2015-07-13 DIAGNOSIS — N201 Calculus of ureter: Secondary | ICD-10-CM

## 2015-07-13 LAB — BASIC METABOLIC PANEL
Anion gap: 9 (ref 5–15)
BUN: 12 mg/dL (ref 6–20)
CALCIUM: 9.2 mg/dL (ref 8.9–10.3)
CO2: 28 mmol/L (ref 22–32)
CREATININE: 1.25 mg/dL — AB (ref 0.61–1.24)
Chloride: 106 mmol/L (ref 101–111)
GFR calc Af Amer: >60 mL/min (ref >60)
GLUCOSE: 137 mg/dL — AB (ref 65–99)
Potassium: 3.8 mmol/L (ref 3.5–5.1)
SODIUM: 143 mmol/L (ref 135–145)

## 2015-07-13 LAB — URINALYSIS, ROUTINE W REFLEX MICROSCOPIC
BILIRUBIN URINE: NEGATIVE
GLUCOSE, UA: NEGATIVE mg/dL
HGB URINE DIPSTICK: NEGATIVE
Ketones, ur: NEGATIVE mg/dL
Leukocytes, UA: NEGATIVE
Nitrite: NEGATIVE
SPECIFIC GRAVITY, URINE: 1.025 (ref 1.005–1.030)
pH: 6 (ref 5.0–8.0)

## 2015-07-13 LAB — URINE MICROSCOPIC-ADD ON
BACTERIA UA: NONE SEEN
SQUAMOUS EPITHELIAL / LPF: NONE SEEN
WBC, UA: NONE SEEN WBC/hpf (ref 0–5)

## 2015-07-13 LAB — CBC WITH DIFFERENTIAL/PLATELET
Basophils Absolute: 0 10*3/uL (ref 0.0–0.1)
Basophils Relative: 0 %
EOS ABS: 0 10*3/uL (ref 0.0–0.7)
EOS PCT: 0 %
HCT: 46.2 % (ref 39.0–52.0)
Hemoglobin: 15.9 g/dL (ref 13.0–17.0)
LYMPHS ABS: 1.1 10*3/uL (ref 0.7–4.0)
LYMPHS PCT: 11 %
MCH: 31.6 pg (ref 26.0–34.0)
MCHC: 34.4 g/dL (ref 30.0–36.0)
MCV: 91.8 fL (ref 78.0–100.0)
MONO ABS: 0.5 10*3/uL (ref 0.1–1.0)
Monocytes Relative: 5 %
Neutro Abs: 8.5 10*3/uL — ABNORMAL HIGH (ref 1.7–7.7)
Neutrophils Relative %: 84 %
PLATELETS: 250 10*3/uL (ref 150–400)
RBC: 5.03 MIL/uL (ref 4.22–5.81)
RDW: 12.3 % (ref 11.5–15.5)
WBC: 10.2 10*3/uL (ref 4.0–10.5)

## 2015-07-13 MED ORDER — ONDANSETRON HCL 4 MG/2ML IJ SOLN
4.0000 mg | Freq: Once | INTRAMUSCULAR | Status: AC
  Administered 2015-07-13: 4 mg via INTRAVENOUS

## 2015-07-13 MED ORDER — ONDANSETRON HCL 4 MG/2ML IJ SOLN
INTRAMUSCULAR | Status: AC
  Administered 2015-07-13: 4 mg via INTRAVENOUS
  Filled 2015-07-13: qty 2

## 2015-07-13 MED ORDER — FENTANYL CITRATE (PF) 100 MCG/2ML IJ SOLN
50.0000 ug | Freq: Once | INTRAMUSCULAR | Status: AC
  Administered 2015-07-13: 50 ug via INTRAVENOUS

## 2015-07-13 MED ORDER — FENTANYL CITRATE (PF) 100 MCG/2ML IJ SOLN
INTRAMUSCULAR | Status: AC
  Administered 2015-07-13: 50 ug via INTRAVENOUS
  Filled 2015-07-13: qty 2

## 2015-07-13 MED ORDER — HYDROMORPHONE HCL 1 MG/ML IJ SOLN
1.0000 mg | Freq: Once | INTRAMUSCULAR | Status: AC
  Administered 2015-07-13: 1 mg via INTRAVENOUS
  Filled 2015-07-13: qty 1

## 2015-07-13 MED ORDER — OXYCODONE-ACETAMINOPHEN 5-325 MG PO TABS: 1.0000 | ORAL_TABLET | Freq: Four times a day (QID) | ORAL | Status: DC | PRN

## 2015-07-13 MED ORDER — KETOROLAC TROMETHAMINE 30 MG/ML IJ SOLN
30.0000 mg | Freq: Once | INTRAMUSCULAR | Status: AC
  Administered 2015-07-13: 30 mg via INTRAVENOUS
  Filled 2015-07-13: qty 1

## 2015-07-13 NOTE — ED Provider Notes (Signed)
CSN: [id number]     Arrival date & time 07/13/15  0905 History  By signing my name below, I, Hilda Lias, attest that this documentation has been prepared under the direction and in the presence of Davonna Belling, MD. Electronically Signed: Hilda Lias, ED Scribe. 07/13/2015. 9:29 AM.    Chief Complaint  Patient presents with  . Flank Pain      Patient is a 45 y.o. male presenting with flank pain. The history is provided by the patient. No language interpreter was used.  Flank Pain   HPI Comments: Cody Henderson is a 45 y.o. male who presents to the Emergency Department complaining of constant, worsening right flank pain that radiates to the right side of the abdomen that has been present since about an hour ago. Pt reports the pain came on suddenly, and he became diaphoretic and nauseous as well. Pt states he has never had kidney stones before. Pt denies having surgery on abdomen or appendicitis. Pt denies dysuria, trouble urinating, urgency, or any other urinary symptoms.    Past Medical History  Diagnosis Date  . Hyperlipidemia    Past Surgical History  Procedure Laterality Date  . Wisdom tooth extraction     Family History  Problem Relation Age of Onset  . Arthritis Mother   . Cancer Mother     colon  . Colon cancer Mother 29  . Cancer Maternal Grandmother     colon and cancer  . Colon cancer Maternal Grandmother     not sure age of onset   Social History  Substance Use Topics  . Smoking status: Never Smoker   . Smokeless tobacco: Never Used  . Alcohol Use: Yes     Comment: rare    Review of Systems  Constitutional: Positive for diaphoresis.  Gastrointestinal: Negative for nausea.  Genitourinary: Positive for flank pain. Negative for dysuria, urgency, frequency, hematuria and difficulty urinating.  All other systems reviewed and are negative.     Allergies  Review of patient's allergies indicates no known allergies.  Home Medications   Prior  to Admission medications   Medication Sig Start Date End Date Taking? Authorizing Provider  ciprofloxacin (CIPRO) 500 MG tablet Take 1 tablet (500 mg total) by mouth 2 (two) times daily. Patient not taking: Reported on 07/13/2015 11/20/14   Marletta Lor, MD  oxyCODONE-acetaminophen (PERCOCET/ROXICET) 5-325 MG tablet Take 1-2 tablets by mouth every 6 (six) hours as needed for severe pain. 07/13/15   Davonna Belling, MD   BP 136/68 mmHg  Pulse 60  Temp(Src)   Resp 18  Ht 6\' 1"  (1.854 m)  Wt 235 lb (106.595 kg)  BMI 31.01 kg/m2  SpO2 100% Physical Exam  Constitutional: He appears well-developed and well-nourished.  Appears uncomfortable  Pulmonary/Chest: Effort normal and breath sounds normal.  Abdominal: There is tenderness.  Mild right abdominal tenderness Mild epigastric tenderness  Musculoskeletal: He exhibits tenderness. He exhibits no edema.  Right CVA tenderness No leg swelling  Skin: He is not diaphoretic. There is pallor.    ED Course  Procedures (including critical care time)  DIAGNOSTIC STUDIES: Oxygen Saturation is 100% on room air, normal by my interpretation.    COORDINATION OF CARE: 9:25 AM Discussed treatment plan with pt at bedside and pt agreed to plan.   Labs Review Labs Reviewed  URINALYSIS, ROUTINE W REFLEX MICROSCOPIC (NOT AT [facility]) - Abnormal; Notable for the following:    Protein, ur TRACE (*)    All other components within normal  limits  CBC WITH DIFFERENTIAL/PLATELET - Abnormal; Notable for the following:    Neutro Abs 8.5 (*)    All other components within normal limits  BASIC METABOLIC PANEL - Abnormal; Notable for the following:    Glucose, Bld 137 (*)    Creatinine, Ser 1.25 (*)    All other components within normal limits  URINE MICROSCOPIC-ADD ON    Imaging Review Ct Renal Stone Study  07/13/2015  CLINICAL DATA:  Right flank pain for 2 hours radiating to the mid abdomen. EXAM: CT ABDOMEN AND PELVIS WITHOUT CONTRAST TECHNIQUE:  Multidetector CT imaging of the abdomen and pelvis was performed following the standard protocol without IV contrast. COMPARISON:  None. FINDINGS: Lower chest:  Lung bases are clear.  Normal heart size. Hepatobiliary: Diffuse low attenuation of the liver as can be seen with hepatic steatosis. No hepatic mass. Normal gallbladder. Pancreas: Normal. Spleen: Normal. Adrenals/Urinary Tract: Normal adrenal glands. No renal mass. 4 mm right UVJ calculus. No obstructive uropathy. Decompressed bladder. Stomach/Bowel: No bowel wall thickening or dilatation. No pneumatosis, pneumoperitoneum portal venous gas. Normal appendix. No abdominal or pelvic free fluid. Vascular/Lymphatic: Normal caliber abdominal aorta. No lymphadenopathy. Other: No fluid collection or hematoma. Musculoskeletal: No acute osseous abnormality. No lytic or sclerotic osseous lesion. 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 IMPRESSION: 1.  4 mm right UVJ calculus without obstructive uropathy. 2. Hepatic steatosis. Electronically Signed   By: Kathreen Devoid   On: 07/13/2015 11:11   I have personally reviewed and evaluated these images and lab results as part of my medical decision-making.    MDM   Final diagnoses:  Right flank pain  Right ureteral stone    Patient with flank pain. Has UVJ stone without hydronephrosis. Feels better after treatment will be discharged home follow-up with urology.  I personally performed the services described in this documentation, which was scribed in my presence. The recorded information has been reviewed and is accurate.      Davonna Belling, MD 07/13/15 1536

## 2015-07-13 NOTE — Discharge Instructions (Signed)
Kidney Stones °Kidney stones (urolithiasis) are deposits that form inside your kidneys. The intense pain is caused by the stone moving through the urinary tract. When the stone moves, the ureter goes into spasm around the stone. The stone is usually passed in the urine.  °CAUSES  °· A disorder that makes certain neck glands produce too much parathyroid hormone (primary hyperparathyroidism). °· A buildup of uric acid crystals, similar to gout in your joints. °· Narrowing (stricture) of the ureter. °· A kidney obstruction present at birth (congenital obstruction). °· Previous surgery on the kidney or ureters. °· Numerous kidney infections. °SYMPTOMS  °· Feeling sick to your stomach (nauseous). °· Throwing up (vomiting). °· Blood in the urine (hematuria). °· Pain that usually spreads (radiates) to the groin. °· Frequency or urgency of urination. °DIAGNOSIS  °· Taking a history and physical exam. °· Blood or urine tests. °· CT scan. °· Occasionally, an examination of the inside of the urinary bladder (cystoscopy) is performed. °TREATMENT  °· Observation. °· Increasing your fluid intake. °· Extracorporeal shock wave lithotripsy--This is a noninvasive procedure that uses shock waves to break up kidney stones. °· Surgery may be needed if you have severe pain or persistent obstruction. There are various surgical procedures. Most of the procedures are performed with the use of small instruments. Only small incisions are needed to accommodate these instruments, so recovery time is minimized. °The size, location, and chemical composition are all important variables that will determine the proper choice of action for you. Talk to your health care provider to better understand your situation so that you will minimize the risk of injury to yourself and your kidney.  °HOME CARE INSTRUCTIONS  °· Drink enough water and fluids to keep your urine clear or pale yellow. This will help you to pass the stone or stone fragments. °· Strain  all urine through the provided strainer. Keep all particulate matter and stones for your health care provider to see. The stone causing the pain may be as small as a grain of salt. It is very important to use the strainer each and every time you pass your urine. The collection of your stone will allow your health care provider to analyze it and verify that a stone has actually passed. The stone analysis will often identify what you can do to reduce the incidence of recurrences. °· Only take over-the-counter or prescription medicines for pain, discomfort, or fever as directed by your health care provider. °· Keep all follow-up visits as told by your health care provider. This is important. °· Get follow-up X-rays if required. The absence of pain does not always mean that the stone has passed. It may have only stopped moving. If the urine remains completely obstructed, it can cause loss of kidney function or even complete destruction of the kidney. It is your responsibility to make sure X-rays and follow-ups are completed. Ultrasounds of the kidney can show blockages and the status of the kidney. Ultrasounds are not associated with any radiation and can be performed easily in a matter of minutes. °· Make changes to your daily diet as told by your health care provider. You may be told to: °¨ Limit the amount of salt that you eat. °¨ Eat 5 or more servings of fruits and vegetables each day. °¨ Limit the amount of meat, poultry, fish, and eggs that you eat. °· Collect a 24-hour urine sample as told by your health care provider. You may need to collect another urine sample every 6-12   months. °SEEK MEDICAL CARE IF: °· You experience pain that is progressive and unresponsive to any pain medicine you have been prescribed. °SEEK IMMEDIATE MEDICAL CARE IF:  °· Pain cannot be controlled with the prescribed medicine. °· You have a fever or shaking chills. °· The severity or intensity of pain increases over 18 hours and is not  relieved by pain medicine. °· You develop a new onset of abdominal pain. °· You feel faint or pass out. °· You are unable to urinate. °  °This information is not intended to replace advice given to you by your health care provider. Make sure you discuss any questions you have with your health care provider. °  °Document Released: 06/02/2005 Document Revised: 02/21/2015 Document Reviewed: 11/03/2012 °Elsevier Interactive Patient Education ©2016 [employer] ° °

## 2015-07-13 NOTE — ED Notes (Signed)
[place] at bedside.  

## 2015-07-13 NOTE — ED Notes (Signed)
Pt reports sudden onset of r flank pain with n/v since this morning.

## 2017-08-08 IMAGING — CT CT RENAL STONE PROTOCOL
2 of 4 series · 16 of 46 positions shown, 18 images · non-contrast
Comparison: None.

CLINICAL DATA: Right flank pain for 2 hours radiating to the mid
abdomen.

EXAM:
CT ABDOMEN AND PELVIS WITHOUT CONTRAST
TECHNIQUE: Multidetector CT imaging of the abdomen and pelvis was performed
following the standard protocol without IV contrast.

[Series 2: standard/full over (age)lbs 5.0 · axial · 0.88mm/px · z∈[-492,-2]mm · 13 of 108 slices shown, 15 images]
[im 5/108  soft-tissue]
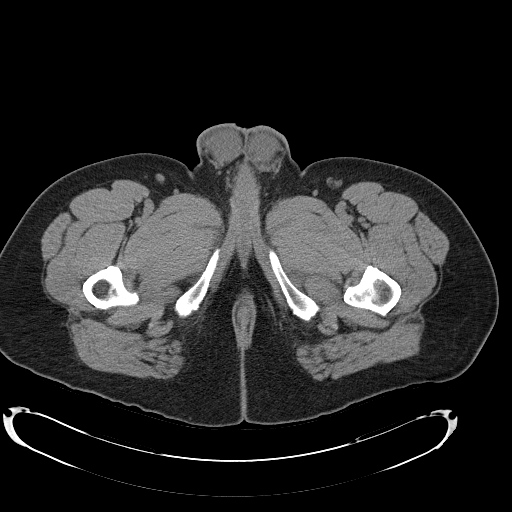
[im 5/108  bone]
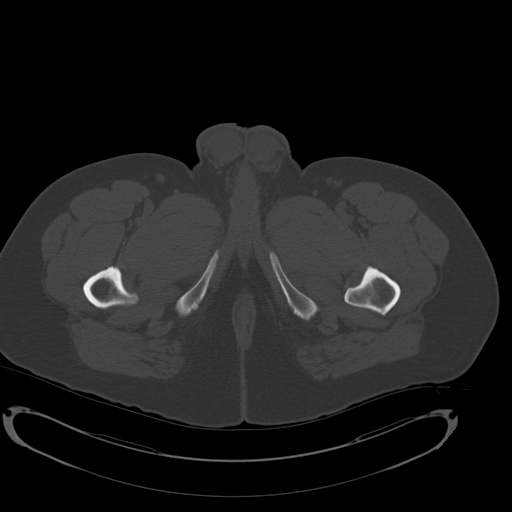
[im 14/108  soft-tissue]
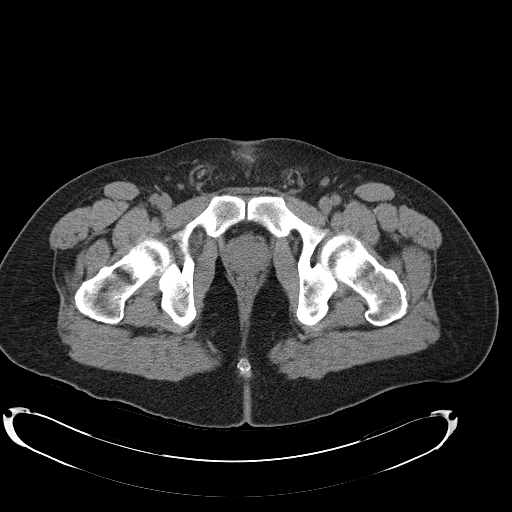
[im 24/108  soft-tissue]
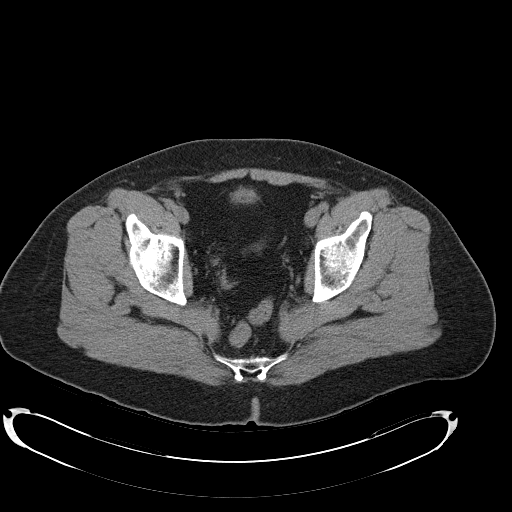
[im 28/108  soft-tissue]
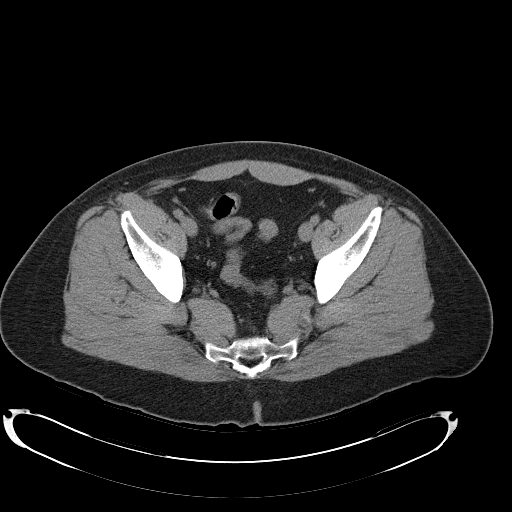
[im 38/108  soft-tissue]
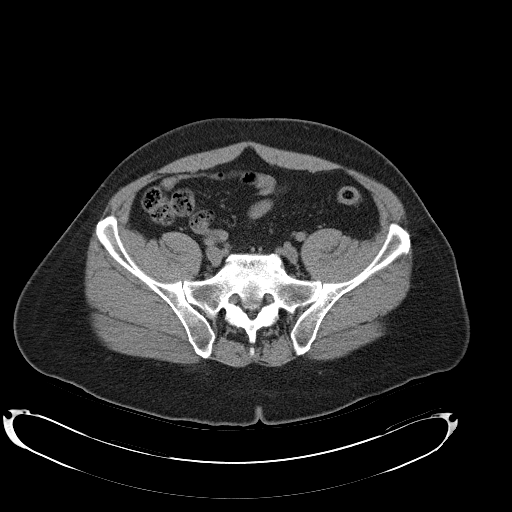
[im 47/108  soft-tissue]
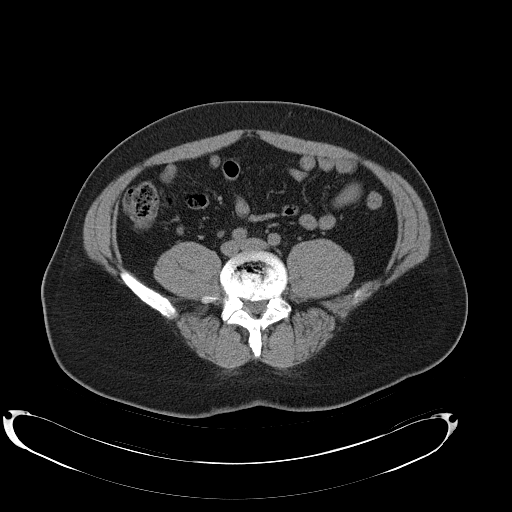
[im 56/108  soft-tissue]
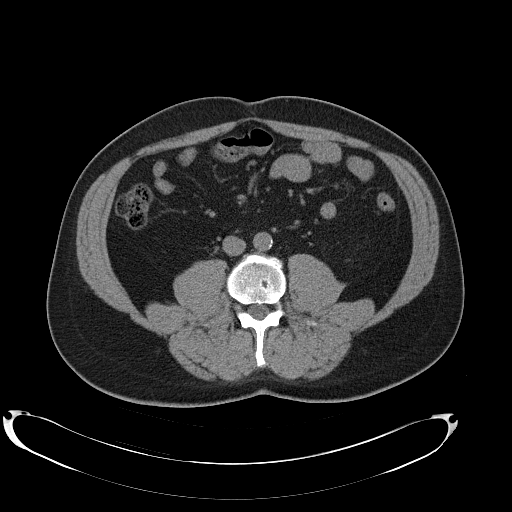
[im 61/108  soft-tissue]
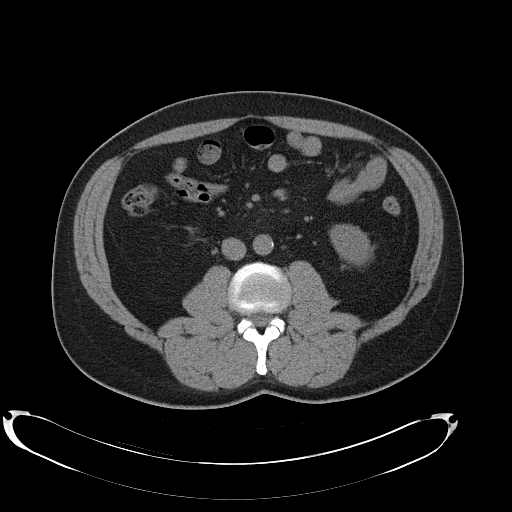
[im 70/108  soft-tissue]
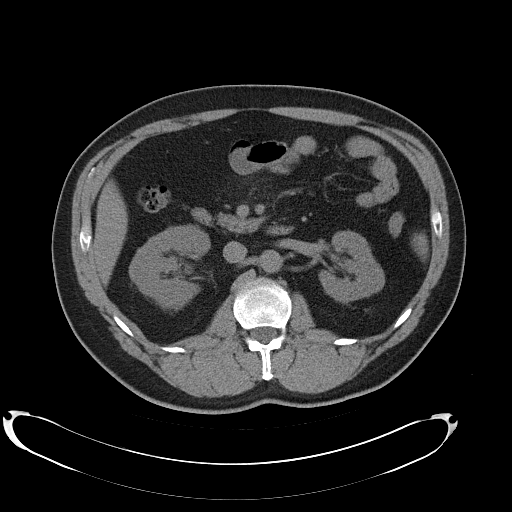
[im 70/108  bone]
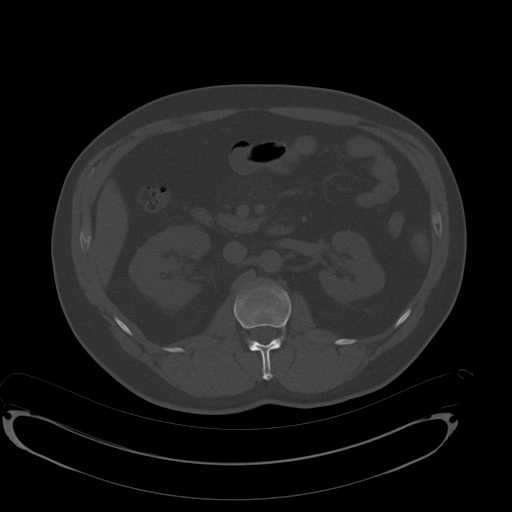
[im 80/108  soft-tissue]
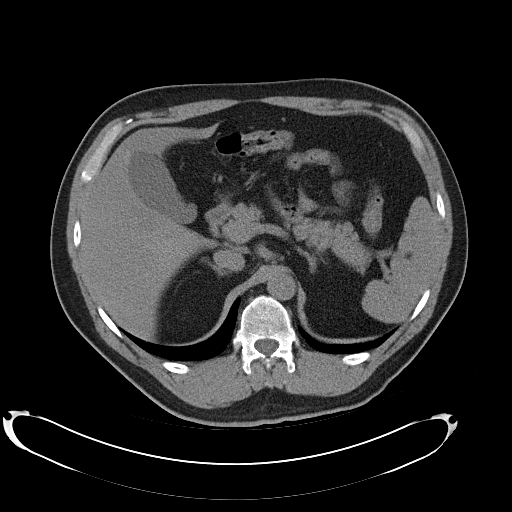
[im 84/108  soft-tissue]
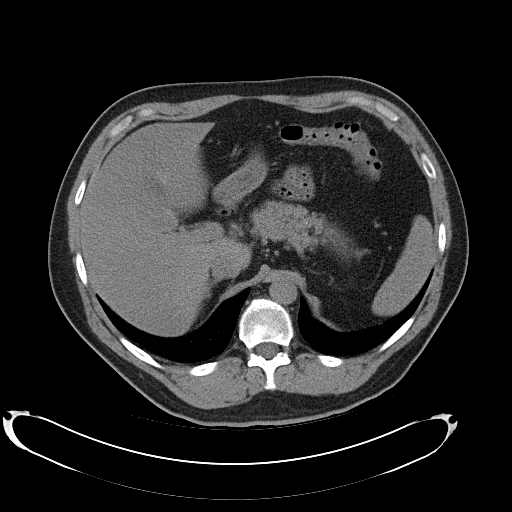
[im 94/108  soft-tissue]
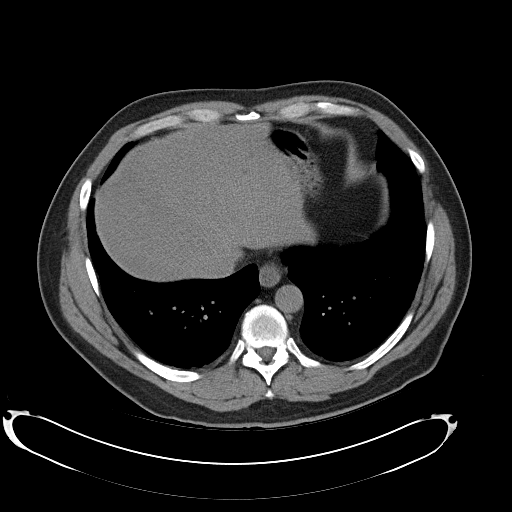
[im 103/108  soft-tissue]
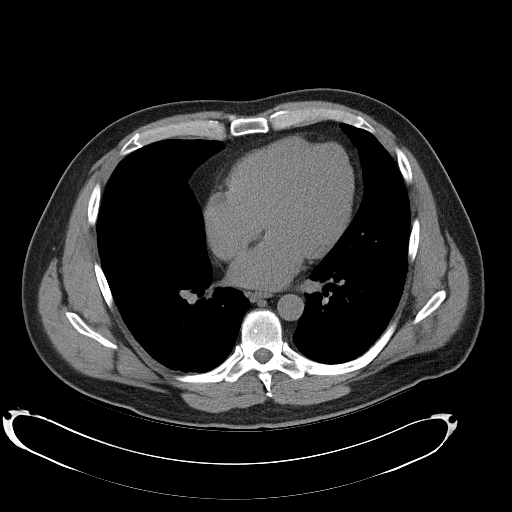

[Series 3: mpr coronal · coronal · 0.82mm/px · 3 of 102 slices shown]
[im 34/102  soft-tissue]
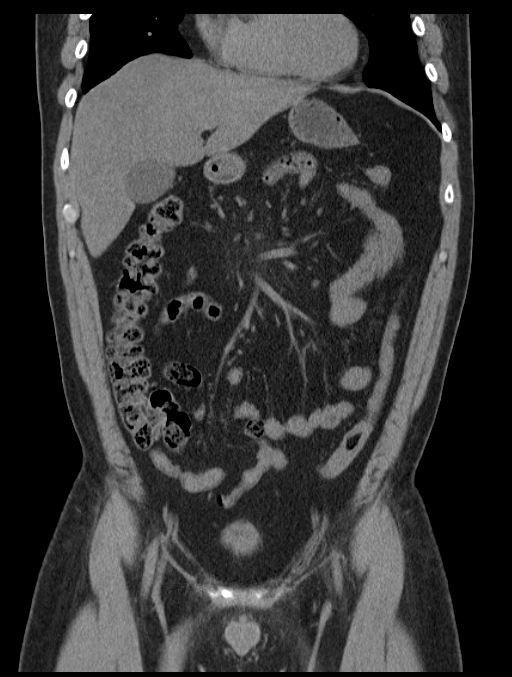
[im 45/102  soft-tissue]
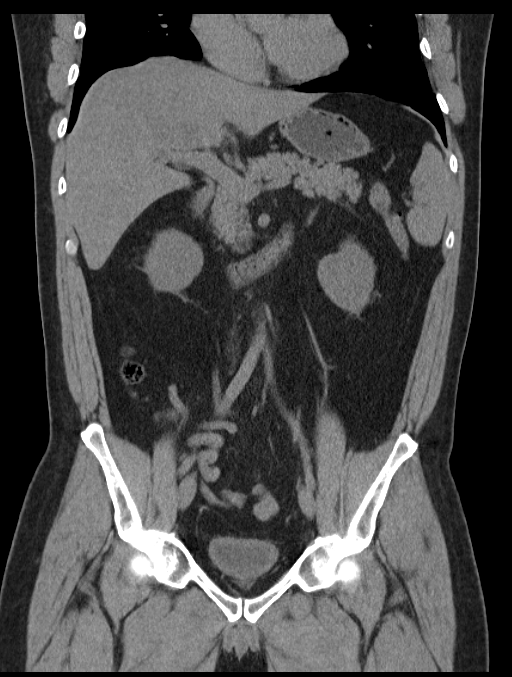
[im 57/102  soft-tissue]
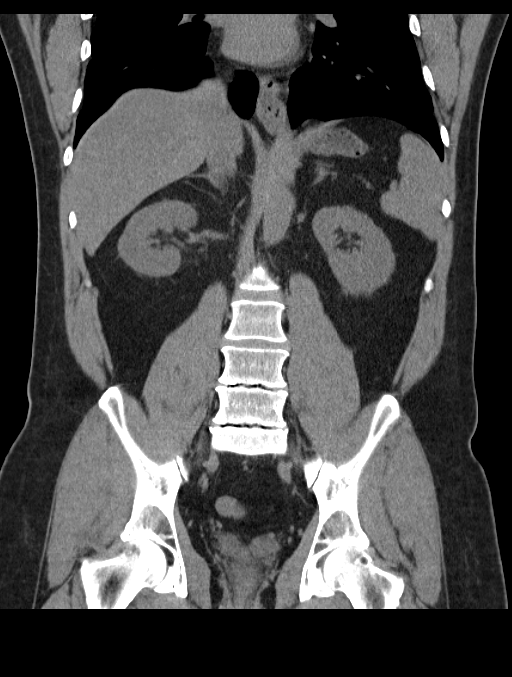

[16 of 46 positions shown; findings below may reference images not displayed]

FINDINGS: Lower chest:  Lung bases are clear.  Normal heart size.

Hepatobiliary: Diffuse low attenuation of the liver as can be seen
with hepatic steatosis. No hepatic mass. Normal gallbladder.

Pancreas: Normal.

Spleen: Normal.

Adrenals/Urinary Tract: Normal adrenal glands. No renal mass. 4 mm
right UVJ calculus. No obstructive uropathy. Decompressed bladder.

Stomach/Bowel: No bowel wall thickening or dilatation. No
pneumatosis, pneumoperitoneum portal venous gas. Normal appendix. No
abdominal or pelvic free fluid.

Vascular/Lymphatic: Normal caliber abdominal aorta. No
lymphadenopathy.

Other: No fluid collection or hematoma.

Musculoskeletal: No acute osseous abnormality. No lytic or sclerotic
osseous lesion. Degenerative disc disease with disc height loss at
L4-5 and L5-S1 with facet arthropathy.
IMPRESSION: 1.  4 mm right UVJ calculus without obstructive uropathy.
2. Hepatic steatosis.

## 2018-12-03 ENCOUNTER — Encounter: Payer: Self-pay | Admitting: Gastroenterology

## 2019-07-01 ENCOUNTER — Encounter: Payer: Self-pay | Admitting: Gastroenterology

## 2019-07-11 ENCOUNTER — Other Ambulatory Visit: Payer: Self-pay

## 2019-07-11 ENCOUNTER — Ambulatory Visit (AMBULATORY_SURGERY_CENTER): Payer: 59

## 2019-07-11 VITALS — Ht 73.0 in | Wt 250.0 lb

## 2019-07-11 DIAGNOSIS — Z01818 Encounter for other preprocedural examination: Secondary | ICD-10-CM

## 2019-07-11 DIAGNOSIS — Z800 Family history of malignant neoplasm of digestive organs: Secondary | ICD-10-CM

## 2019-07-11 MED ORDER — NA SULFATE-K SULFATE-MG SULF 17.5-3.13-1.6 GM/177ML PO SOLN
1.0000 | Freq: Once | ORAL | 0 refills | Status: AC
Start: 2019-07-11 — End: 2019-07-11

## 2019-07-11 NOTE — Progress Notes (Signed)
No egg or soy allergy known to patient  No issues with past sedation with any surgeries  or procedures, no intubation problems  No diet pills per patient No home 02 use per patient  No blood thinners per patient  Pt denies issues with constipation  No A fib or A flutter  EMMI video sent to pt's e mail  suprep coupon given  VIRTUAL PREVISIT   Due to the COVID-19 pandemic we are asking patients to follow these guidelines. Please only bring one care partner. Please be aware that your care partner may wait in the car in the parking lot or if they feel like they will be too hot to wait in the car, they may wait in the lobby on the 4th floor. All care partners are required to wear a mask the entire time (we do not have any that we can provide them), they need to practice social distancing, and we will do a Covid check for all patient's and care partners when you arrive. Also we will check their temperature and your temperature. If the care partner waits in their car they need to stay in the parking lot the entire time and we will call them on their cell phone when the patient is ready for discharge so they can bring the car to the front of the building. Also all patient's will need to wear a mask into building.

## 2019-07-21 ENCOUNTER — Other Ambulatory Visit: Payer: Self-pay

## 2019-07-21 ENCOUNTER — Other Ambulatory Visit (HOSPITAL_COMMUNITY)
Admission: RE | Admit: 2019-07-21 | Discharge: 2019-07-21 | Disposition: A | Discharge status: Home / Self Care | Payer: 59 | Source: Ambulatory Visit | Attending: Gastroenterology | Admitting: Gastroenterology

## 2019-07-21 DIAGNOSIS — Z01812 Encounter for preprocedural laboratory examination: Secondary | ICD-10-CM | POA: Diagnosis present

## 2019-07-21 DIAGNOSIS — Z20822 Contact with and (suspected) exposure to covid-19: Secondary | ICD-10-CM | POA: Insufficient documentation

## 2019-07-21 LAB — SARS CORONAVIRUS 2 (TAT 6-24 HRS): SARS Coronavirus 2: NEGATIVE

## 2019-07-25 ENCOUNTER — Other Ambulatory Visit: Payer: Self-pay

## 2019-07-25 ENCOUNTER — Ambulatory Visit (AMBULATORY_SURGERY_CENTER): Payer: 59 | Admitting: Gastroenterology

## 2019-07-25 ENCOUNTER — Encounter: Payer: Self-pay | Admitting: Gastroenterology

## 2019-07-25 VITALS — BP 113/70 | HR 70 | Temp 97.3°F | Resp 20 | Ht 73.0 in | Wt 250.0 lb

## 2019-07-25 DIAGNOSIS — K621 Rectal polyp: Secondary | ICD-10-CM

## 2019-07-25 DIAGNOSIS — Z1211 Encounter for screening for malignant neoplasm of colon: Secondary | ICD-10-CM | POA: Diagnosis not present

## 2019-07-25 DIAGNOSIS — Z800 Family history of malignant neoplasm of digestive organs: Secondary | ICD-10-CM

## 2019-07-25 DIAGNOSIS — D128 Benign neoplasm of rectum: Secondary | ICD-10-CM

## 2019-07-25 MED ORDER — SODIUM CHLORIDE 0.9 % IV SOLN: 500.0000 mL | Freq: Once | INTRAVENOUS | Status: DC

## 2019-07-25 NOTE — Patient Instructions (Signed)
1 polyp removed and sent to pathology.  Dr Ardis Hughs will mail you a letter explaining these results in about 2 weeks.     OU HAD AN ENDOSCOPIC PROCEDURE TODAY AT [facility]:   Refer to the procedure report that was given to you for any specific questions about what was found during the examination.  If the procedure report does not answer your questions, please call your gastroenterologist to clarify.  If you requested that your care partner not be given the details of your procedure findings, then the procedure report has been included in a sealed envelope for you to review at your convenience later.  YOU SHOULD EXPECT: Some feelings of bloating in the abdomen. Passage of more gas than usual.  Walking can help get rid of the air that was put into your GI tract during the procedure and reduce the bloating. If you had a lower endoscopy (such as a colonoscopy or flexible sigmoidoscopy) you may notice spotting of blood in your stool or on the toilet paper. If you underwent a bowel prep for your procedure, you may not have a normal bowel movement for a few days.  Please Note:  You might notice some irritation and congestion in your nose or some drainage.  This is from the oxygen used during your procedure.  There is no need for concern and it should clear up in a day or so.  SYMPTOMS TO REPORT IMMEDIATELY:   Following lower endoscopy (colonoscopy or flexible sigmoidoscopy):  Excessive amounts of blood in the stool  Significant tenderness or worsening of abdominal pains  Swelling of the abdomen that is new, acute  Fever of 100F or higher   For urgent or emergent issues, a gastroenterologist can be reached at any hour by calling [phone].   DIET:  We do recommend a small meal at first, but then you may proceed to your regular diet.  Drink plenty of fluids but you should avoid alcoholic beverages for 24 hours.  ACTIVITY:  You should plan to take it easy for the rest of today  and you should NOT DRIVE or use heavy machinery until tomorrow (because of the sedation medicines used during the test).    FOLLOW UP: Our staff will call the number listed on your records 48-72 hours following your procedure to check on you and address any questions or concerns that you may have regarding the information given to you following your procedure. If we do not reach you, we will leave a message.  We will attempt to reach you two times.  During this call, we will ask if you have developed any symptoms of COVID 19. If you develop any symptoms (ie: fever, flu-like symptoms, shortness of breath, cough etc.) before then, please call [phone].  If you test positive for Covid 19 in the 2 weeks post procedure, please call and report this information to [place].    If any biopsies were taken you will be contacted by phone or by letter within the next 1-3 weeks.  Please call us at [phone] if you have not heard about the biopsies in 3 weeks.    SIGNATURES/CONFIDENTIALITY: You and/or your care partner have signed paperwork which will be entered into your electronic medical record.  These signatures attest to the fact that that the information above on your After Visit Summary has been reviewed and is understood.  Full responsibility of the confidentiality of this discharge information lies with you and/or your care-partner.

## 2019-07-25 NOTE — Progress Notes (Signed)
Report to PACU, RN, vss, BBS= Clear.  

## 2019-07-25 NOTE — Progress Notes (Signed)
Pt's states no medical or surgical changes since previsit or office visit.  Temp-LC  V/S-DT

## 2019-07-25 NOTE — Op Note (Signed)
[facility] Patient Name: Cody Henderson Procedure Date: 07/25/2019 10:12 AM MRN: [id number] Endoscopist: Milus Banister , MD Age: 49 Referring MD:  Date of Birth: 04/27/1971 Gender: Male Account #: [id number] Procedure:                Colonoscopy Indications:              Screening in patient at increased risk: Family                            history of 1st-degree relative with colorectal                            cancer before age 15 years; mother had colon cancer                            in her 72s. Colonoscopy 2015 single subCM [facility] Medicines:                Monitored Anesthesia Care Procedure:                Pre-Anesthesia Assessment:                           - Prior to the procedure, a History and Physical                            was performed, and patient medications and                            allergies were reviewed. The patient's tolerance of                            previous anesthesia was also reviewed. The risks                            and benefits of the procedure and the sedation                            options and risks were discussed with the patient.                            All questions were answered, and informed consent                            was obtained. Prior Anticoagulants: The patient has                            taken no previous anticoagulant or antiplatelet                            agents. ASA Grade Assessment: II - A patient with                            mild systemic disease. After reviewing the risks  and benefits, the patient was deemed in                            satisfactory condition to undergo the procedure.                           After obtaining informed consent, the colonoscope                            was passed under direct vision. Throughout the                            procedure, the patient's blood pressure, pulse, and                            oxygen saturations  were monitored continuously. The                            Colonoscope was introduced through the anus and                            advanced to the the cecum, identified by                            appendiceal orifice and ileocecal valve. The                            colonoscopy was performed without difficulty. The                            patient tolerated the procedure well. The quality                            of the bowel preparation was good. The ileocecal                            valve, appendiceal orifice, and rectum were                            photographed. Scope In: 10:16:48 AM Scope Out: 10:26:32 AM Scope Withdrawal Time: 0 hours 9 minutes 31 seconds  Total Procedure Duration: 0 hours 9 minutes 44 seconds  Findings:                 A 2 mm polyp was found in the rectum. The polyp was                            sessile. The polyp was removed with a cold snare.                            Resection and retrieval were complete.                           The exam was otherwise without abnormality on  direct and retroflexion views. Complications:            No immediate complications. Estimated blood loss:                            None. Estimated Blood Loss:     Estimated blood loss: none. Impression:               - One 2 mm polyp in the rectum, removed with a cold                            snare. Resected and retrieved.                           - The examination was otherwise normal on direct                            and retroflexion views. Recommendation:           - Patient has a contact number available for                            emergencies. The signs and symptoms of potential                            delayed complications were discussed with the                            patient. Return to normal activities tomorrow.                            Written discharge instructions were provided to the                             patient.                           - Resume previous diet.                           - Continue present medications.                           - Await pathology results. Milus Banister, MD 07/25/2019 10:28:33 AM This report has been signed electronically.

## 2019-07-27 ENCOUNTER — Telehealth: Payer: Self-pay

## 2019-07-27 NOTE — Telephone Encounter (Signed)
Attempted to reach patient for post-procedure f/u call. No answer. Left message for him to please not hesitate to call us if he has any questions/concerns regarding his care. 

## 2019-07-27 NOTE — Telephone Encounter (Signed)
Attempted to reach patient for post-procedure f/u call. No answer. Left message that we will make another attempt to reach him later today and for him to please not hesitate to call us if he has any questions/concerns regarding his care. 

## 2019-08-02 ENCOUNTER — Encounter: Payer: Self-pay | Admitting: Gastroenterology

## 2020-04-19 ENCOUNTER — Other Ambulatory Visit: Payer: Self-pay

## 2020-04-19 ENCOUNTER — Encounter: Payer: Self-pay | Admitting: Emergency Medicine

## 2020-04-19 ENCOUNTER — Ambulatory Visit
Admission: EM | Admit: 2020-04-19 | Discharge: 2020-04-19 | Disposition: A | Discharge status: Home / Self Care | Payer: 59 | Attending: Emergency Medicine | Admitting: Emergency Medicine

## 2020-04-19 DIAGNOSIS — J029 Acute pharyngitis, unspecified: Secondary | ICD-10-CM

## 2020-04-19 DIAGNOSIS — R5383 Other fatigue: Secondary | ICD-10-CM | POA: Diagnosis not present

## 2020-04-19 DIAGNOSIS — U071 COVID-19: Secondary | ICD-10-CM

## 2020-04-19 HISTORY — DX: COVID-19: U07.1

## 2020-04-19 LAB — POCT RAPID STREP A (OFFICE): Rapid Strep A Screen: NEGATIVE

## 2020-04-19 MED ORDER — LIDOCAINE VISCOUS HCL 2 % MT SOLN: 15.0000 mL | OROMUCOSAL | 0 refills | Status: DC | PRN

## 2020-04-19 NOTE — ED Provider Notes (Signed)
[facility]   [id number] 04/19/20 Arrival Time: 30   CC: Sore throat  SUBJECTIVE: History from: patient.  AXYL SITZMAN is a 49 y.o. male who presents with sore throat and fatigue x 2 days ago.  Denies sick exposure to COVID, flu or strep.  Has tried OTC medications without relief.  Symptoms are made worse with swallowing, but tolerating own secretions.  Denies previous covid infection in the past.   Denies fever, chills, fatigue, sinus pain, rhinorrhea, cough, SOB, wheezing, chest pain, nausea, changes in bowel or bladder habits.    ROS: As per HPI.  All other pertinent ROS negative.     Past Medical History:  Diagnosis Date  . Hyperlipidemia   . Psoriasis    Past Surgical History:  Procedure Laterality Date  . COLONOSCOPY  12/27/2013   hypoplastic polyp  . WISDOM TOOTH EXTRACTION     No Known Allergies No current facility-administered medications on file prior to encounter.   Current Outpatient Medications on File Prior to Encounter  Medication Sig Dispense Refill  . betamethasone dipropionate 0.05 % cream Apply topically 2 (two) times daily as needed.     Social History   Socioeconomic History  . Marital status: Married    Spouse name: Not on file  . Number of children: Not on file  . Years of education: Not on file  . Highest education level: Not on file  Occupational History  . Occupation: buyer    Comment: Art therapist  Tobacco Use  . Smoking status: Never Smoker  . Smokeless tobacco: Never Used  Vaping Use  . Vaping Use: Never used  Substance and Sexual Activity  . Alcohol use: Yes    Comment: rare less than 1 per week  . Drug use: No  . Sexual activity: Not on file  Other Topics Concern  . Not on file  Social History Narrative  . Not on file   Social Determinants of Health   Financial Resource Strain:   . Difficulty of Paying Living Expenses: Not on file  Food Insecurity:   . Worried About Charity fundraiser in the Last Year:  Not on file  . Ran Out of Food in the Last Year: Not on file  Transportation Needs:   . Lack of Transportation (Medical): Not on file  . Lack of Transportation (Non-Medical): Not on file  Physical Activity:   . Days of Exercise per Week: Not on file  . Minutes of Exercise per Session: Not on file  Stress:   . Feeling of Stress : Not on file  Social Connections:   . Frequency of Communication with Friends and Family: Not on file  . Frequency of Social Gatherings with Friends and Family: Not on file  . Attends Religious Services: Not on file  . Active Member of Clubs or Organizations: Not on file  . Attends Archivist Meetings: Not on file  . Marital Status: Not on file  Intimate Partner Violence:   . Fear of Current or Ex-Partner: Not on file  . Emotionally Abused: Not on file  . Physically Abused: Not on file  . Sexually Abused: Not on file   Family History  Problem Relation Age of Onset  . Arthritis Mother   . Cancer Mother        colon  . Colon cancer Mother 62  . Colon polyps Mother   . Cancer Maternal Grandmother        colon and cancer  .  Colon cancer Maternal Grandmother        not sure age of onset  . Esophageal cancer Neg Hx   . Rectal cancer Neg Hx   . Stomach cancer Neg Hx     OBJECTIVE:  Vitals:   04/19/20 1336 04/19/20 1339  BP: (!) 136/91   Pulse: 100   Resp: 19   Temp: 98.7 F (37.1 C)   TempSrc: Oral   SpO2: 93%   Weight:  240 lb (108.9 kg)  Height:  6\' 1"  (1.854 m)     General appearance: alert; appears mildly fatigued, but nontoxic; speaking in full sentences and tolerating own secretions HEENT: NCAT; Ears: EACs clear, TMs pearly gray; Eyes: PERRL.  EOM grossly intact. Nose: nares patent without rhinorrhea, Throat: oropharynx clear, tonsils mildly erythematous not enlarged, uvula midline  Neck: supple without LAD Lungs: unlabored respirations, symmetrical air entry; cough: absent; no respiratory distress; CTAB Heart: regular rate  and rhythm.   Skin: warm and dry Psychological: alert and cooperative; normal mood and affect  LABS:  Results for orders placed or performed during the hospital encounter of 04/19/20 (from the past 24 hour(s))  POCT rapid strep A     Status: None   Collection Time: 04/19/20  1:44 PM  Result Value Ref Range   Rapid Strep A Screen Negative Negative     ASSESSMENT & PLAN:  1. Other fatigue   2. Sore throat     Meds ordered this encounter  Medications  . lidocaine (XYLOCAINE) 2 % solution    Sig: Use as directed 15 mLs in the mouth or throat as needed for mouth pain (Do NOT exceed 8 doses in a 24 hour period).    Dispense:  100 mL    Refill:  0    Order Specific Question:   Supervising Provider    Answer:   Raylene Everts [[id number]]   Strep negative.  Culture sent.   COVID testing ordered.  It will take between 5-7 days for test results.  Someone will contact you regarding abnormal results.    In the meantime: You should remain isolated in your home for 10 days from symptom onset AND greater than 72 hours after symptoms resolution (absence of fever without the use of fever-reducing medication and improvement in respiratory symptoms), whichever is longer Get plenty of rest and push fluids Viscous lidocaine prescribed.  This is an oral solution you can swish, gargle, and/or swallow as needed for symptomatic relief of sore throat.  Do not exceed 8 doses in a 24 hour period.  Do not use prior to eating, as this will numb your entire mouth.    Use OTC zyrtec for nasal congestion, runny nose, and/or sore throat Use OTC flonase for nasal congestion and runny nose Use medications daily for symptom relief Use OTC medications like ibuprofen or tylenol as needed fever or pain Call or go to the ED if you have any new or worsening symptoms such as fever, cough, shortness of breath, chest tightness, chest pain, turning blue, changes in mental status, etc...   Reviewed expectations re:  course of current medical issues. Questions answered. Outlined signs and symptoms indicating need for more acute intervention. Patient verbalized understanding. After Visit Summary given.         Lestine Box, PA-C 04/19/20 1350

## 2020-04-19 NOTE — Discharge Instructions (Signed)
Strep negative.  Culture sent.   COVID testing ordered.  It will take between 5-7 days for test results.  Someone will contact you regarding abnormal results.    In the meantime: You should remain isolated in your home for 10 days from symptom onset AND greater than 72 hours after symptoms resolution (absence of fever without the use of fever-reducing medication and improvement in respiratory symptoms), whichever is longer Get plenty of rest and push fluids Viscous lidocaine prescribed.  This is an oral solution you can swish, gargle, and/or swallow as needed for symptomatic relief of sore throat.  Do not exceed 8 doses in a 24 hour period.  Do not use prior to eating, as this will numb your entire mouth.    Use OTC zyrtec for nasal congestion, runny nose, and/or sore throat Use OTC flonase for nasal congestion and runny nose Use medications daily for symptom relief Use OTC medications like ibuprofen or tylenol as needed fever or pain Call or go to the ED if you have any new or worsening symptoms such as fever, cough, shortness of breath, chest tightness, chest pain, turning blue, changes in mental status, etc..Marland Kitchen

## 2020-04-19 NOTE — ED Triage Notes (Signed)
Sore throat and fatigue since Tuesday

## 2020-04-20 LAB — CULTURE, GROUP A STREP (THRC)

## 2020-04-20 LAB — NOVEL CORONAVIRUS, NAA: SARS-CoV-2, NAA: DETECTED — AB

## 2020-04-20 LAB — SARS-COV-2, NAA 2 DAY TAT

## 2020-04-21 ENCOUNTER — Other Ambulatory Visit (HOSPITAL_COMMUNITY): Payer: Self-pay | Admitting: Family

## 2020-04-21 ENCOUNTER — Ambulatory Visit (HOSPITAL_COMMUNITY): Payer: 59

## 2020-04-21 ENCOUNTER — Ambulatory Visit (HOSPITAL_COMMUNITY)
Admission: RE | Admit: 2020-04-21 | Discharge: 2020-04-21 | Disposition: A | Discharge status: Home / Self Care | Payer: 59 | Source: Ambulatory Visit | Attending: Pulmonary Disease | Admitting: Pulmonary Disease

## 2020-04-21 DIAGNOSIS — D849 Immunodeficiency, unspecified: Secondary | ICD-10-CM | POA: Insufficient documentation

## 2020-04-21 DIAGNOSIS — U071 COVID-19: Secondary | ICD-10-CM

## 2020-04-21 MED ORDER — SOTROVIMAB 500 MG/8ML IV SOLN
500.0000 mg | Freq: Once | INTRAVENOUS | Status: AC
  Administered 2020-04-21: 500 mg via INTRAVENOUS

## 2020-04-21 MED ORDER — SODIUM CHLORIDE 0.9 % IV BOLUS
1000.0000 mL | Freq: Once | INTRAVENOUS | Status: AC
  Administered 2020-04-21: 1000 mL via INTRAVENOUS

## 2020-04-21 MED ORDER — METHYLPREDNISOLONE SODIUM SUCC 125 MG IJ SOLR: 125.0000 mg | Freq: Once | INTRAMUSCULAR | Status: DC | PRN

## 2020-04-21 MED ORDER — SODIUM CHLORIDE 0.9 % IV SOLN: INTRAVENOUS | Status: DC | PRN

## 2020-04-21 MED ORDER — FAMOTIDINE IN NACL 20-0.9 MG/50ML-% IV SOLN: 20.0000 mg | Freq: Once | INTRAVENOUS | Status: DC | PRN

## 2020-04-21 MED ORDER — ALBUTEROL SULFATE HFA 108 (90 BASE) MCG/ACT IN AERS: 2.0000 | INHALATION_SPRAY | Freq: Once | RESPIRATORY_TRACT | Status: DC | PRN

## 2020-04-21 MED ORDER — ONDANSETRON HCL 4 MG/2ML IJ SOLN
4.0000 mg | Freq: Once | INTRAMUSCULAR | Status: AC
  Administered 2020-04-21: 4 mg via INTRAVENOUS
  Filled 2020-04-21: qty 2

## 2020-04-21 MED ORDER — EPINEPHRINE 0.3 MG/0.3ML IJ SOAJ: 0.3000 mg | Freq: Once | INTRAMUSCULAR | Status: DC | PRN

## 2020-04-21 MED ORDER — DIPHENHYDRAMINE HCL 50 MG/ML IJ SOLN: 50.0000 mg | Freq: Once | INTRAMUSCULAR | Status: DC | PRN

## 2020-04-21 MED ORDER — ACETAMINOPHEN 325 MG PO TABS
650.0000 mg | ORAL_TABLET | Freq: Once | ORAL | Status: AC
  Administered 2020-04-21: 650 mg via ORAL
  Filled 2020-04-21: qty 2

## 2020-04-21 NOTE — Progress Notes (Signed)
I connected by phone with Cody Henderson on 04/21/2020 at 1:13 PM to discuss the potential use of a new treatment for mild to moderate COVID-19 viral infection in non-hospitalized patients.  This patient is a 49 y.o. male that meets the FDA criteria for Emergency Use Authorization of COVID monoclonal antibody casirivimab/imdevimab, bamlanivimab/eteseviamb, or sotrovimab.  Has a (+) direct SARS-CoV-2 viral test result  Has mild or moderate COVID-19   Is NOT hospitalized due to COVID-19  Is within 10 days of symptom onset  Has at least one of the high risk factor(s) for progression to severe COVID-19 and/or hospitalization as defined in EUA.  Specific high risk criteria : Immunosuppressive Disease or Treatment   Symptoms of sore throat, chills, and fatigue began 04/17/20.   I have spoken and communicated the following to the patient or parent/caregiver regarding COVID monoclonal antibody treatment:  1. FDA has authorized the emergency use for the treatment of mild to moderate COVID-19 in adults and pediatric patients with positive results of direct SARS-CoV-2 viral testing who are 76 years of age and older weighing at least 40 kg, and who are at high risk for progressing to severe COVID-19 and/or hospitalization.  2. The significant known and potential risks and benefits of COVID monoclonal antibody, and the extent to which such potential risks and benefits are unknown.  3. Information on available alternative treatments and the risks and benefits of those alternatives, including clinical trials.  4. Patients treated with COVID monoclonal antibody should continue to self-isolate and use infection control measures (e.g., wear mask, isolate, social distance, avoid sharing personal items, clean and disinfect "high touch" surfaces, and frequent handwashing) according to CDC guidelines.   5. The patient or parent/caregiver has the option to accept or refuse COVID monoclonal antibody  treatment.  After reviewing this information with the patient, the patient has agreed to receive one of the available covid 19 monoclonal antibodies and will be provided an appropriate fact sheet prior to infusion. Asencion Gowda, NP 04/21/2020 1:13 PM

## 2020-04-21 NOTE — Discharge Instructions (Signed)
10 Things You Can Do to Manage Your COVID-19 Symptoms at Home If you have possible or confirmed COVID-19: 1. Stay home from work and school. And stay away from other public places. If you must go out, avoid using any kind of public transportation, ridesharing, or taxis. 2. Monitor your symptoms carefully. If your symptoms get worse, call your healthcare provider immediately. 3. Get rest and stay hydrated. 4. If you have a medical appointment, call the healthcare provider ahead of time and tell them that you have or may have COVID-19. 5. For medical emergencies, call 911 and notify the dispatch personnel that you have or may have COVID-19. 6. Cover your cough and sneezes with a tissue or use the inside of your elbow. 7. Wash your hands often with soap and water for at least 20 seconds or clean your hands with an alcohol-based hand sanitizer that contains at least 60% alcohol. 8. As much as possible, stay in a specific room and away from other people in your home. Also, you should use a separate bathroom, if available. If you need to be around other people in or outside of the home, wear a mask. 9. Avoid sharing personal items with other people in your household, like dishes, towels, and bedding. 10. Clean all surfaces that are touched often, like counters, tabletops, and doorknobs. Use household cleaning sprays or wipes according to the label instructions. [email or web address] 12/15/2018 This information is not intended to replace advice given to you by your health care provider. Make sure you discuss any questions you have with your health care provider. Document Revised: 05/19/2019 Document Reviewed: 05/19/2019 Elsevier Patient Education  2020 [employer].   COVID-19 COVID-19 is a respiratory infection that is caused by a virus called severe acute respiratory syndrome coronavirus 2 (SARS-CoV-2). The disease is also known as coronavirus disease or novel coronavirus. In some people, the virus may  not cause any symptoms. In others, it may cause a serious infection. The infection can get worse quickly and can lead to complications, such as:  Pneumonia, or infection of the lungs.  Acute respiratory distress syndrome or ARDS. This is a condition in which fluid build-up in the lungs prevents the lungs from filling with air and passing oxygen into the blood.  Acute respiratory failure. This is a condition in which there is not enough oxygen passing from the lungs to the body or when carbon dioxide is not passing from the lungs out of the body.  Sepsis or septic shock. This is a serious bodily reaction to an infection.  Blood clotting problems.  Secondary infections due to bacteria or fungus.  Organ failure. This is when your body's organs stop working. The virus that causes COVID-19 is contagious. This means that it can spread from person to person through droplets from coughs and sneezes (respiratory secretions). What are the causes? This illness is caused by a virus. You may catch the virus by:  Breathing in droplets from an infected person. Droplets can be spread by a person breathing, speaking, singing, coughing, or sneezing.  Touching something, like a table or a doorknob, that was exposed to the virus (contaminated) and then touching your mouth, nose, or eyes. What increases the risk? Risk for infection You are more likely to be infected with this virus if you:  Are within 6 feet (2 meters) of a person with COVID-19.  Provide care for or live with a person who is infected with COVID-19.  Spend time in crowded indoor spaces or  live in shared housing. Risk for serious illness You are more likely to become seriously ill from the virus if you:  Are 27 years of age or older. The higher your age, the more you are at risk for serious illness.  Live in a nursing home or long-term care facility.  Have cancer.  Have a long-term (chronic) disease such as: ? Chronic lung disease,  including chronic obstructive pulmonary disease or asthma. ? A long-term disease that lowers your body's ability to fight infection (immunocompromised). ? Heart disease, including heart failure, a condition in which the arteries that lead to the heart become narrow or blocked (coronary artery disease), a disease which makes the heart muscle thick, weak, or stiff (cardiomyopathy). ? Diabetes. ? Chronic kidney disease. ? Sickle cell disease, a condition in which red blood cells have an abnormal "sickle" shape. ? Liver disease.  Are obese. What are the signs or symptoms? Symptoms of this condition can range from mild to severe. Symptoms may appear any time from 2 to 14 days after being exposed to the virus. They include:  A fever or chills.  A cough.  Difficulty breathing.  Headaches, body aches, or muscle aches.  Runny or stuffy (congested) nose.  A sore throat.  New loss of taste or smell. Some people may also have stomach problems, such as nausea, vomiting, or diarrhea. Other people may not have any symptoms of COVID-19. How is this diagnosed? This condition may be diagnosed based on:  Your signs and symptoms, especially if: ? You live in an area with a COVID-19 outbreak. ? You recently traveled to or from an area where the virus is common. ? You provide care for or live with a person who was diagnosed with COVID-19. ? You were exposed to a person who was diagnosed with COVID-19.  A physical exam.  Lab tests, which may include: ? Taking a sample of fluid from the back of your nose and throat (nasopharyngeal fluid), your nose, or your throat using a swab. ? A sample of mucus from your lungs (sputum). ? Blood tests.  Imaging tests, which may include, X-rays, CT scan, or ultrasound. How is this treated? At present, there is no medicine to treat COVID-19. Medicines that treat other diseases are being used on a trial basis to see if they are effective against COVID-19. Your  health care provider will talk with you about ways to treat your symptoms. For most people, the infection is mild and can be managed at home with rest, fluids, and over-the-counter medicines. Treatment for a serious infection usually takes places in a hospital intensive care unit (ICU). It may include one or more of the following treatments. These treatments are given until your symptoms improve.  Receiving fluids and medicines through an IV.  Supplemental oxygen. Extra oxygen is given through a tube in the nose, a face mask, or a hood.  Positioning you to lie on your stomach (prone position). This makes it easier for oxygen to get into the lungs.  Continuous positive airway pressure (CPAP) or bi-level positive airway pressure (BPAP) machine. This treatment uses mild air pressure to keep the airways open. A tube that is connected to a motor delivers oxygen to the body.  Ventilator. This treatment moves air into and out of the lungs by using a tube that is placed in your windpipe.  Tracheostomy. This is a procedure to create a hole in the neck so that a breathing tube can be inserted.  Extracorporeal membrane  oxygenation (ECMO). This procedure gives the lungs a chance to recover by taking over the functions of the heart and lungs. It supplies oxygen to the body and removes carbon dioxide. Follow these instructions at home: Lifestyle  If you are sick, stay home except to get medical care. Your health care provider will tell you how long to stay home. Call your health care provider before you go for medical care.  Rest at home as told by your health care provider.  Do not use any products that contain nicotine or tobacco, such as cigarettes, e-cigarettes, and chewing tobacco. If you need help quitting, ask your health care provider.  Return to your normal activities as told by your health care provider. Ask your health care provider what activities are safe for you. General  instructions  Take over-the-counter and prescription medicines only as told by your health care provider.  Drink enough fluid to keep your urine pale yellow.  Keep all follow-up visits as told by your health care provider. This is important. How is this prevented?  There is no vaccine to help prevent COVID-19 infection. However, there are steps you can take to protect yourself and others from this virus. To protect yourself:   Do not travel to areas where COVID-19 is a risk. The areas where COVID-19 is reported change often. To identify high-risk areas and travel restrictions, check the CDC travel website: FatFares.com.br  If you live in, or must travel to, an area where COVID-19 is a risk, take precautions to avoid infection. ? Stay away from people who are sick. ? Wash your hands often with soap and water for 20 seconds. If soap and water are not available, use an alcohol-based hand sanitizer. ? Avoid touching your mouth, face, eyes, or nose. ? Avoid going out in public, follow guidance from your state and local health authorities. ? If you must go out in public, wear a cloth face covering or face mask. Make sure your mask covers your nose and mouth. ? Avoid crowded indoor spaces. Stay at least 6 feet (2 meters) away from others. ? Disinfect objects and surfaces that are frequently touched every day. This may include:  Counters and tables.  Doorknobs and light switches.  Sinks and faucets.  Electronics, such as phones, remote controls, keyboards, computers, and tablets. To protect others: If you have symptoms of COVID-19, take steps to prevent the virus from spreading to others.  If you think you have a COVID-19 infection, contact your health care provider right away. Tell your health care team that you think you may have a COVID-19 infection.  Stay home. Leave your house only to seek medical care. Do not use public transport.  Do not travel while you are  sick.  Wash your hands often with soap and water for 20 seconds. If soap and water are not available, use alcohol-based hand sanitizer.  Stay away from other members of your household. Let healthy household members care for children and pets, if possible. If you have to care for children or pets, wash your hands often and wear a mask. If possible, stay in your own room, separate from others. Use a different bathroom.  Make sure that all people in your household wash their hands well and often.  Cough or sneeze into a tissue or your sleeve or elbow. Do not cough or sneeze into your hand or into the air.  Wear a cloth face covering or face mask. Make sure your mask covers your nose  and mouth. Where to find more information  Centers for Disease Control and Prevention: PurpleGadgets.be  World Health Organization: https://www.castaneda.info/ Contact a health care provider if:  You live in or have traveled to an area where COVID-19 is a risk and you have symptoms of the infection.  You have had contact with someone who has COVID-19 and you have symptoms of the infection. Get help right away if:  You have trouble breathing.  You have pain or pressure in your chest.  You have confusion.  You have bluish lips and fingernails.  You have difficulty waking from sleep.  You have symptoms that get worse. These symptoms may represent a serious problem that is an emergency. Do not wait to see if the symptoms will go away. Get medical help right away. Call your local emergency services (911 in the [place] Do not drive yourself to the hospital. Let the emergency medical personnel know if you think you have COVID-19. Summary  COVID-19 is a respiratory infection that is caused by a virus. It is also known as coronavirus disease or novel coronavirus. It can cause serious infections, such as pneumonia, acute respiratory distress syndrome, acute respiratory failure,  or sepsis.  The virus that causes COVID-19 is contagious. This means that it can spread from person to person through droplets from breathing, speaking, singing, coughing, or sneezing.  You are more likely to develop a serious illness if you are 47 years of age or older, have a weak immune system, live in a nursing home, or have chronic disease.  There is no medicine to treat COVID-19. Your health care provider will talk with you about ways to treat your symptoms.  Take steps to protect yourself and others from infection. Wash your hands often and disinfect objects and surfaces that are frequently touched every day. Stay away from people who are sick and wear a mask if you are sick. This information is not intended to replace advice given to you by your health care provider. Make sure you discuss any questions you have with your health care provider. Document Revised: 04/01/2019 Document Reviewed: 07/08/2018 Elsevier Patient Education  [employer].

## 2020-04-21 NOTE — Progress Notes (Addendum)
Note: Pt given NS bolus per standing order due to tachycardia; tolerated well and no history of CHF or cardiac disease.  Also administered Zofran 4 mg; tolerated well and able to consume snack.  Given Tylenol 650 mg for fever; appropriate response from 100.8 to 100 F.    Diagnosis: COVID-19  Physician: Dr. Joya Gaskins  Procedure: Allergies reviewed.  Sotrovimab administered via IV infusion after med fact sheet provided to patient and all questions answered. Discharge instructions provided to patient; all questions answered.   Complications: No immediate complications noted.  Discharge: Discharged home   Monna Fam 04/21/2020

## 2020-04-22 LAB — CULTURE, GROUP A STREP (THRC)

## 2020-04-27 ENCOUNTER — Encounter: Payer: Self-pay | Admitting: Emergency Medicine

## 2020-04-27 ENCOUNTER — Ambulatory Visit
Admission: EM | Admit: 2020-04-27 | Discharge: 2020-04-27 | Disposition: A | Discharge status: Home / Self Care | Payer: 59 | Attending: Emergency Medicine | Admitting: Emergency Medicine

## 2020-04-27 DIAGNOSIS — U071 COVID-19: Secondary | ICD-10-CM | POA: Diagnosis not present

## 2020-04-27 DIAGNOSIS — R110 Nausea: Secondary | ICD-10-CM

## 2020-04-27 DIAGNOSIS — J3489 Other specified disorders of nose and nasal sinuses: Secondary | ICD-10-CM

## 2020-04-27 MED ORDER — ONDANSETRON HCL 4 MG PO TABS: 4.0000 mg | ORAL_TABLET | Freq: Four times a day (QID) | ORAL | 0 refills | Status: DC

## 2020-04-27 NOTE — ED Provider Notes (Signed)
[facility]   [id number] 04/27/20 Arrival Time: 6720   CC: COVID infection  SUBJECTIVE: History from: patient.  Cody Henderson is a 49 y.o. male who presents with sinus drainage and nausea x few days.  Tested positive for covid on 04/20/20.  Apx on day 7 of symptoms.  Tried infusion therapy this past weekend and have fever and chills.  Denies aggravating factors.  Denies previous symptoms in the past.   Denies sinus pain, rhinorrhea, sore throat, SOB, wheezing, chest pain, nausea, changes in bowel or bladder habits.    ROS: As per HPI.  All other pertinent ROS negative.     Past Medical History:  Diagnosis Date  . Hyperlipidemia   . Psoriasis    Past Surgical History:  Procedure Laterality Date  . COLONOSCOPY  12/27/2013   hypoplastic polyp  . WISDOM TOOTH EXTRACTION     No Known Allergies No current facility-administered medications on file prior to encounter.   Current Outpatient Medications on File Prior to Encounter  Medication Sig Dispense Refill  . betamethasone dipropionate 0.05 % cream Apply topically 2 (two) times daily as needed.    . lidocaine (XYLOCAINE) 2 % solution Use as directed 15 mLs in the mouth or throat as needed for mouth pain (Do NOT exceed 8 doses in a 24 hour period). 100 mL 0   Social History   Socioeconomic History  . Marital status: Married    Spouse name: Not on file  . Number of children: Not on file  . Years of education: Not on file  . Highest education level: Not on file  Occupational History  . Occupation: buyer    Comment: Art therapist  Tobacco Use  . Smoking status: Never Smoker  . Smokeless tobacco: Never Used  Vaping Use  . Vaping Use: Never used  Substance and Sexual Activity  . Alcohol use: Yes    Comment: rare less than 1 per week  . Drug use: No  . Sexual activity: Not on file  Other Topics Concern  . Not on file  Social History Narrative  . Not on file   Social Determinants of Health   Financial  Resource Strain:   . Difficulty of Paying Living Expenses: Not on file  Food Insecurity:   . Worried About Charity fundraiser in the Last Year: Not on file  . Ran Out of Food in the Last Year: Not on file  Transportation Needs:   . Lack of Transportation (Medical): Not on file  . Lack of Transportation (Non-Medical): Not on file  Physical Activity:   . Days of Exercise per Week: Not on file  . Minutes of Exercise per Session: Not on file  Stress:   . Feeling of Stress : Not on file  Social Connections:   . Frequency of Communication with Friends and Family: Not on file  . Frequency of Social Gatherings with Friends and Family: Not on file  . Attends Religious Services: Not on file  . Active Member of Clubs or Organizations: Not on file  . Attends Archivist Meetings: Not on file  . Marital Status: Not on file  Intimate Partner Violence:   . Fear of Current or Ex-Partner: Not on file  . Emotionally Abused: Not on file  . Physically Abused: Not on file  . Sexually Abused: Not on file   Family History  Problem Relation Age of Onset  . Arthritis Mother   . Cancer Mother  colon  . Colon cancer Mother 22  . Colon polyps Mother   . Cancer Maternal Grandmother        colon and cancer  . Colon cancer Maternal Grandmother        not sure age of onset  . Esophageal cancer Neg Hx   . Rectal cancer Neg Hx   . Stomach cancer Neg Hx     OBJECTIVE:  Vitals:   04/27/20 1421  BP: (!) 121/92  Pulse: 91  Resp: 19  Temp: 98.2 F (36.8 C)  TempSrc: Oral  SpO2: 91%     General appearance: alert; appears mildly fatigued, but nontoxic; speaking in full sentences and tolerating own secretions HEENT: NCAT; Ears: EACs clear, TMs pearly gray; Eyes: PERRL.  EOM grossly intact. Nose: nares patent without rhinorrhea, Throat: oropharynx clear, tonsils non erythematous or enlarged, uvula midline  Neck: supple without LAD Lungs: unlabored respirations, symmetrical air entry;  cough: absent; no respiratory distress; CTAB Heart: regular rate and rhythm.   Skin: warm and dry Psychological: alert and cooperative; normal mood and affect  ASSESSMENT & PLAN:  1. COVID-19 virus infection   2. Nausea without vomiting   3. Sinus drainage     Meds ordered this encounter  Medications  . ondansetron (ZOFRAN) 4 MG tablet    Sig: Take 1 tablet (4 mg total) by mouth every 6 (six) hours.    Dispense:  12 tablet    Refill:  0    Order Specific Question:   Supervising Provider    Answer:   Raylene Everts [[id number]]   Get plenty of rest and push fluids zofran for nausea Use OTC zyrtec for nasal congestion, runny nose, and/or sore throat Use OTC flonase for nasal congestion and runny nose Use medications daily for symptom relief Use OTC medications like ibuprofen or tylenol as needed fever or pain Follow up with PCP or covid clinic for further management of covid symptoms Call or go to the ED if you have any new or worsening symptoms such as fever, cough, shortness of breath, chest tightness, chest pain, turning blue, changes in mental status, etc...   Reviewed expectations re: course of current medical issues. Questions answered. Outlined signs and symptoms indicating need for more acute intervention. Patient verbalized understanding. After Visit Summary given.         Lestine Box, PA-C 04/27/20 1434

## 2020-04-27 NOTE — Discharge Instructions (Signed)
Get plenty of rest and push fluids zofran for nausea Use OTC zyrtec for nasal congestion, runny nose, and/or sore throat Use OTC flonase for nasal congestion and runny nose Use medications daily for symptom relief Use OTC medications like ibuprofen or tylenol as needed fever or pain Follow up with PCP or covid clinic for further management of covid symptoms Call or go to the ED if you have any new or worsening symptoms such as fever, cough, shortness of breath, chest tightness, chest pain, turning blue, changes in mental status, etc..Marland Kitchen

## 2020-04-27 NOTE — ED Triage Notes (Signed)
Pt seen last Thursday for covid s/s and tested positive. Went for covid  infusion on Saturday.  Pt started running a fever Saturday night of 102-103.  Sunday pt had chills and muscle aches.  Pt now feels like he has sinus drainage and nausea from the drainage.

## 2020-04-30 ENCOUNTER — Ambulatory Visit: Payer: 59 | Admitting: Family Medicine

## 2020-05-28 ENCOUNTER — Encounter: Payer: Self-pay | Admitting: Family Medicine

## 2020-05-28 ENCOUNTER — Telehealth: Payer: Self-pay

## 2020-05-28 ENCOUNTER — Ambulatory Visit (INDEPENDENT_AMBULATORY_CARE_PROVIDER_SITE_OTHER): Payer: 59 | Admitting: Family Medicine

## 2020-05-28 ENCOUNTER — Other Ambulatory Visit: Payer: Self-pay

## 2020-05-28 VITALS — BP 110/71 | HR 59 | Temp 98.0°F | Resp 16 | Ht 73.0 in | Wt 246.6 lb

## 2020-05-28 DIAGNOSIS — E669 Obesity, unspecified: Secondary | ICD-10-CM

## 2020-05-28 DIAGNOSIS — Z125 Encounter for screening for malignant neoplasm of prostate: Secondary | ICD-10-CM

## 2020-05-28 DIAGNOSIS — Z23 Encounter for immunization: Secondary | ICD-10-CM | POA: Diagnosis not present

## 2020-05-28 DIAGNOSIS — Z0000 Encounter for general adult medical examination without abnormal findings: Secondary | ICD-10-CM

## 2020-05-28 LAB — PSA: PSA: 0.66 ng/mL (ref 0.10–4.00)

## 2020-05-28 LAB — CBC WITH DIFFERENTIAL/PLATELET
Basophils Absolute: 0 10*3/uL (ref 0.0–0.1)
Basophils Relative: 0.8 % (ref 0.0–3.0)
Eosinophils Absolute: 0.1 10*3/uL (ref 0.0–0.7)
Eosinophils Relative: 2.5 % (ref 0.0–5.0)
HCT: 44.6 % (ref 39.0–52.0)
Hemoglobin: 15.2 g/dL (ref 13.0–17.0)
Lymphocytes Relative: 29 % (ref 12.0–46.0)
Lymphs Abs: 1.6 10*3/uL (ref 0.7–4.0)
MCHC: 34.1 g/dL (ref 30.0–36.0)
MCV: 92.6 fl (ref 78.0–100.0)
Monocytes Absolute: 0.5 10*3/uL (ref 0.1–1.0)
Monocytes Relative: 8.7 % (ref 3.0–12.0)
Neutro Abs: 3.2 10*3/uL (ref 1.4–7.7)
Neutrophils Relative %: 59 % (ref 43.0–77.0)
Platelets: 248 10*3/uL (ref 150.0–400.0)
RBC: 4.82 Mil/uL (ref 4.22–5.81)
RDW: 13.4 % (ref 11.5–15.5)
WBC: 5.5 10*3/uL (ref 4.0–10.5)

## 2020-05-28 LAB — COMPREHENSIVE METABOLIC PANEL
ALT: 42 U/L (ref 0–53)
AST: 26 U/L (ref 0–37)
Albumin: 4.1 g/dL (ref 3.5–5.2)
Alkaline Phosphatase: 56 U/L (ref 39–117)
BUN: 11 mg/dL (ref 6–23)
CO2: 29 mEq/L (ref 19–32)
Calcium: 9 mg/dL (ref 8.4–10.5)
Chloride: 102 mEq/L (ref 96–112)
Creatinine, Ser: 0.99 mg/dL (ref 0.40–1.50)
GFR: 88.15 mL/min (ref >60.00)
Glucose, Bld: 80 mg/dL (ref 70–99)
Potassium: 3.9 mEq/L (ref 3.5–5.1)
Sodium: 138 mEq/L (ref 135–145)
Total Bilirubin: 0.7 mg/dL (ref 0.2–1.2)
Total Protein: 6.8 g/dL (ref 6.0–8.3)

## 2020-05-28 LAB — LIPID PANEL
Cholesterol: 209 mg/dL — ABNORMAL HIGH (ref 0–200)
HDL: 41.3 mg/dL (ref >39.00)
LDL Cholesterol: 139 mg/dL — ABNORMAL HIGH (ref 0–99)
NonHDL: 167.26
Total CHOL/HDL Ratio: 5
Triglycerides: 141 mg/dL (ref 0.0–149.0)
VLDL: 28.2 mg/dL (ref 0.0–40.0)

## 2020-05-28 LAB — TSH: TSH: 1.49 u[IU]/mL (ref 0.35–4.50)

## 2020-05-28 NOTE — Progress Notes (Signed)
Office Note 05/28/2020  CC:  Chief Complaint  Patient presents with  . Establish Care    Previous PCP, Dr.Swords.   . Annual Exam    Pt is fasting   HPI:  Cody Henderson is a 49 y.o. male who is here to establish care and get cpe. Patient's most recent primary MD: Dr. Leanne Chang. Old records in EP[facility] EMR were reviewed prior to or during today's visit.  No acute complaints, pt is feeling well.  Active but no formal exercise. Regular american diet. Consistently 235-245 lbs.  Past Medical History:  Diagnosis Date  . COVID-19 virus infection 04/19/2020   monoclonal ab infusion  . Family history of colon cancer in mother    mother CC 21s:  pt with colonoscopies 2009, 2015, and 2021-->hyperplastic polyps each time.  Recall 07/2024.  Marland Kitchen History of kidney stones 2017   2017 R ureterolithiasis  . Hyperlipidemia   . Psoriasis   . UTI (urinary tract infection)    x 2 in 2013 (renal u/s normal)    Past Surgical History:  Procedure Laterality Date  . COLONOSCOPY  11/29/07;12/27/2013; 07/25/19   hypoplastic polyps each time.  Recall 07/2024.  Marland Kitchen WISDOM TOOTH EXTRACTION      Family History  Problem Relation Age of Onset  . Arthritis Mother   . Cancer Mother        colon  . Colon cancer Mother 26  . Colon polyps Mother   . Stroke Mother   . Colon cancer Maternal Grandmother        not sure age of onset  . Stroke Maternal Grandmother   . Esophageal cancer Neg Hx   . Rectal cancer Neg Hx   . Stomach cancer Neg Hx     Social History   Socioeconomic History  . Marital status: Married    Spouse name: Not on file  . Number of children: Not on file  . Years of education: Not on file  . Highest education level: Not on file  Occupational History  . Occupation: buyer    Comment: Art therapist  Tobacco Use  . Smoking status: Never Smoker  . Smokeless tobacco: Never Used  Vaping Use  . Vaping Use: Never used  Substance and Sexual Activity  . Alcohol use: Yes     Comment: rare less than 1 per week  . Drug use: No  . Sexual activity: Not on file  Other Topics Concern  . Not on file  Social History Narrative   Married, 2 children.     Lives and works in [place].   Educ: BA from [employer]   Occup: buyer for [employer]   No T/A/Ds.   Social Determinants of Health   Financial Resource Strain: Not on file  Food Insecurity: Not on file  Transportation Needs: Not on file  Physical Activity: Not on file  Stress: Not on file  Social Connections: Not on file  Intimate Partner Violence: Not on file    Outpatient Encounter Medications as of 05/28/2020  Medication Sig  . betamethasone dipropionate 0.05 % cream Apply topically 2 (two) times daily as needed. (Patient not taking: Reported on 05/28/2020)  . [DISCONTINUED] lidocaine (XYLOCAINE) 2 % solution Use as directed 15 mLs in the mouth or throat as needed for mouth pain (Do NOT exceed 8 doses in a 24 hour period).  . [DISCONTINUED] ondansetron (ZOFRAN) 4 MG tablet Take 1 tablet (4 mg total) by mouth every 6 (six) hours.   No facility-administered encounter medications  on file as of 05/28/2020.    No Known Allergies  ROS Review of Systems  Constitutional: Negative for appetite change, chills, fatigue and fever.  HENT: Negative for congestion, dental problem, ear pain and sore throat.   Eyes: Negative for discharge, redness and visual disturbance.  Respiratory: Negative for cough, chest tightness, shortness of breath and wheezing.   Cardiovascular: Negative for chest pain, palpitations and leg swelling.  Gastrointestinal: Negative for abdominal pain, blood in stool, diarrhea, nausea and vomiting.  Genitourinary: Negative for difficulty urinating, dysuria, flank pain, frequency, hematuria and urgency.  Musculoskeletal: Negative for arthralgias, back pain, joint swelling, myalgias and neck stiffness.  Skin: Negative for pallor and rash.  Neurological: Negative for dizziness, speech difficulty, weakness  and headaches.  Hematological: Negative for adenopathy. Does not bruise/bleed easily.  Psychiatric/Behavioral: Negative for confusion and sleep disturbance. The patient is not nervous/anxious.    PE; Blood pressure 110/71, pulse (!) 59, temperature 98 F (36.7 C), temperature source Oral, resp. rate 16, height 6\' 1"  (1.854 m), weight 246 lb 9.6 oz (111.9 kg), SpO2 96 %. Body mass index is 32.53 kg/m.  Gen: Alert, well appearing.  Patient is oriented to person, place, time, and situation. AFFECT: pleasant, lucid thought and speech. ENT: Ears: EACs clear, normal epithelium.  TMs with good light reflex and landmarks bilaterally.  Eyes: no injection, icteris, swelling, or exudate.  EOMI, PERRLA. Nose: no drainage or turbinate edema/swelling.  No injection or focal lesion.  Mouth: lips without lesion/swelling.  Oral mucosa pink and moist.  Dentition intact and without obvious caries or gingival swelling.  Oropharynx without erythema, exudate, or swelling.  Neck: supple/nontender.  No LAD, mass, or TM.  Carotid pulses 2+ bilaterally, without bruits. CV: RRR, no m/r/g.   LUNGS: CTA bilat, nonlabored resps, good aeration in all lung fields. ABD: soft, NT, ND, BS normal.  No hepatospenomegaly or mass.  No bruits. EXT: no clubbing, cyanosis, or edema.  Musculoskeletal: no joint swelling, erythema, warmth, or tenderness.  ROM of all joints intact. Skin - no sores or suspicious lesions or rashes or color changes  Pertinent labs:  Lab Results  Component Value Date   TSH 0.74 09/30/2010   Lab Results  Component Value Date   WBC 10.2 07/13/2015   HGB 15.9 07/13/2015   HCT 46.2 07/13/2015   MCV 91.8 07/13/2015   PLT 250 07/13/2015   Lab Results  Component Value Date   CREATININE 1.25 (H) 07/13/2015   BUN 12 07/13/2015   NA 143 07/13/2015   K 3.8 07/13/2015   CL 106 07/13/2015   CO2 28 07/13/2015   Lab Results  Component Value Date   ALT 26 09/30/2010   AST 21 09/30/2010   ALKPHOS 48  09/30/2010   BILITOT 0.8 09/30/2010   Lab Results  Component Value Date   CHOL 178 09/30/2010   Lab Results  Component Value Date   HDL 40.90 09/30/2010   Lab Results  Component Value Date   LDLCALC 115 (H) 09/30/2010   Lab Results  Component Value Date   TRIG 113.0 09/30/2010   Lab Results  Component Value Date   CHOLHDL 4 09/30/2010    ASSESSMENT AND PLAN:   New pt:  Health maintenance exam: Reviewed age and gender appropriate health maintenance issues (prudent diet, regular exercise, health risks of tobacco and excessive alcohol, use of seatbelts, fire alarms in home, use of sunscreen).  Also reviewed age and gender appropriate health screening as well as vaccine recommendations. Vaccines: declined  flu and covid vaccines.  Tdap UTD.  Shingrix->#1 today. Labs: fasting HP + PSA today. Prostate ca screening: PSA today Colon ca screening: +FH CC mother->recall 07/2024.  An After Visit Summary was printed and given to the patient.  Return in about 1 year (around 05/28/2021) for annual CPE (fasting).  Signed:  Crissie Sickles, MD           05/28/2020

## 2020-05-28 NOTE — Patient Instructions (Signed)

## 2020-05-28 NOTE — Telephone Encounter (Signed)
Patient seen today for establish care appt and brought physician results form for completion.   Please fax once complete.

## 2020-05-28 NOTE — Addendum Note (Signed)
Addended by: Deveron Furlong D on: 05/28/2020 02:17 PM   Modules accepted: Orders

## 2020-05-29 ENCOUNTER — Encounter: Payer: Self-pay | Admitting: Family Medicine

## 2020-05-29 NOTE — Telephone Encounter (Signed)
Form filled out and placed on PCP desk for signature

## 2020-05-30 NOTE — Telephone Encounter (Signed)
Signed and put in box to go up front. Signed:  Crissie Sickles, MD           05/30/2020

## 2020-05-30 NOTE — Telephone Encounter (Signed)
Form faxed and sent to scan

## 2020-07-31 ENCOUNTER — Ambulatory Visit: Payer: 59 | Admitting: Family Medicine

## 2020-08-09 ENCOUNTER — Ambulatory Visit: Payer: 59 | Attending: Internal Medicine

## 2020-08-09 DIAGNOSIS — Z23 Encounter for immunization: Secondary | ICD-10-CM

## 2020-08-09 NOTE — Progress Notes (Signed)
   [id number]  Name:  Cody Henderson [facility].    MRN: [id number] DOB: [birth year]  08/09/2020  Mr. Cody Henderson was observed post Covid-19 immunization for 15 minutes without incident. He was provided with Vaccine Information Sheet and instruction to access the V-Safe system.   Mr. Cody Henderson was instructed to call 911 with any severe reactions post vaccine: Marland Kitchen Difficulty breathing  . Swelling of face and throat  . A fast heartbeat  . A bad rash all over body  . Dizziness and weakness   Immunizations Administered    Name Date Dose VIS Date Route   JANSSEN COVID-19 VACCINE 08/09/2020  4:10 PM 0.5 mL 04/04/2020 Intramuscular   Manufacturer: [employer]ph[employer]a [employer]   Lot: [id number]   [place]: [phone]

## 2020-09-25 NOTE — Progress Notes (Signed)
OFFICE VISIT  09/26/2020  CC:  Chief Complaint  Patient presents with  . Trouble sleeping    Ongoing for 1 year, has tried otc mediations such as melatonin.    HPI:    Patient is a 50 y.o. male who presents for "trouble sleeping". Long hx of "light sleeper", trouble initiating sleep and frequent awakenings, has always been an early riser. Situation complicated by his feeling bilat "rotator cuff" area shoulder pain frequently in the night, noted when he wakes up in night and realizes he's been lying on shoulder.  He can't sleep in prone position. Takes tylenol 1000 mg most evenings to pre-empt this pain best he can. No daytime shoulder pains, can do what he wants w/out prob, no need for tylenol. Describes good sleep hygiene.  Denies depression but definitely deals with chronic work stress. Melatonin 10-20 mg doses a bit helpful to initiate sleep but not maintain. No neck pain, no arm paresthesias or radiating pain, no arm weakness. No hx of signif shoulder injury.  Past Medical History:  Diagnosis Date  . COVID-19 virus infection 04/19/2020   monoclonal ab infusion  . Family history of colon cancer in mother    mother CC 24s:  pt with colonoscopies 2009, 2015, and 2021-->hyperplastic polyps each time.  Recall 07/2024.  Marland Kitchen History of kidney stones 2017   2017 R ureterolithiasis  . Hyperlipidemia    10 yr Frhm = 5% 05/2020-->TLC  . Psoriasis   . UTI (urinary tract infection)    x 2 in 2013 (renal u/s normal)    Past Surgical History:  Procedure Laterality Date  . COLONOSCOPY  11/29/07;12/27/2013; 07/25/19   hypoplastic polyps each time.  Recall 07/2024.  Marland Kitchen WISDOM TOOTH EXTRACTION      Outpatient Medications Prior to Visit  Medication Sig Dispense Refill  . betamethasone dipropionate 0.05 % cream Apply topically 2 (two) times daily as needed.     No facility-administered medications prior to visit.    No Known Allergies  ROS As per HPI  PE: Vitals with BMI 09/26/2020  05/28/2020 04/27/2020  Height 6\' 1"  6\' 1"  -  Weight 250 lbs 6 oz 246 lbs 10 oz -  BMI 29.92 42.68 -  Systolic 341 962 229  Diastolic 70 71 92  Pulse 68 59 91     Gen: Alert, well appearing.  Patient is oriented to person, place, time, and situation. AFFECT: pleasant, lucid thought and speech. No TTP of either shoulder. Mild pain bilat anterolat shoulder region with supination---esp 90 -160 deg--but can push through to full ROM.  No pain with resisted ER/IR.  Speeds and yergason's neg. UE strength and sensation intact.  LABS:    Chemistry      Component Value Date/Time   NA 138 05/28/2020 1410   K 3.9 05/28/2020 1410   CL 102 05/28/2020 1410   CO2 29 05/28/2020 1410   BUN 11 05/28/2020 1410   CREATININE 0.99 05/28/2020 1410      Component Value Date/Time   CALCIUM 9.0 05/28/2020 1410   ALKPHOS 56 05/28/2020 1410   AST 26 05/28/2020 1410   ALT 42 05/28/2020 1410   BILITOT 0.7 05/28/2020 1410      IMPRESSION AND PLAN:  1) Insomnia, primary.  Complicated by bilat RC compression/entrapment pain during sleep. No intervention for shoulders indicated at this time.  OK to continue tylenol qhs. We'll do trial of trazodone 50mg , 1-2 qhs prn, #30, rf x 1.  An After Visit Summary was printed and  given to the patient.  FOLLOW UP: Return for 3-4 wk f/u insomnia.  Signed:  Crissie Sickles, MD           09/26/2020

## 2020-09-26 ENCOUNTER — Encounter: Payer: Self-pay | Admitting: Family Medicine

## 2020-09-26 ENCOUNTER — Ambulatory Visit: Payer: 59

## 2020-09-26 ENCOUNTER — Ambulatory Visit: Payer: 59 | Admitting: Family Medicine

## 2020-09-26 ENCOUNTER — Other Ambulatory Visit: Payer: Self-pay

## 2020-09-26 VITALS — BP 109/70 | HR 68 | Temp 97.9°F | Resp 16 | Ht 73.0 in | Wt 250.4 lb

## 2020-09-26 DIAGNOSIS — M25512 Pain in left shoulder: Secondary | ICD-10-CM | POA: Diagnosis not present

## 2020-09-26 DIAGNOSIS — M25511 Pain in right shoulder: Secondary | ICD-10-CM

## 2020-09-26 DIAGNOSIS — Z23 Encounter for immunization: Secondary | ICD-10-CM

## 2020-09-26 DIAGNOSIS — G4709 Other insomnia: Secondary | ICD-10-CM

## 2020-09-26 MED ORDER — TRAZODONE HCL 50 MG PO TABS: ORAL_TABLET | ORAL | 1 refills | Status: AC

## 2020-09-26 NOTE — Addendum Note (Signed)
Addended by: Deveron Furlong D on: 09/26/2020 08:58 AM   Modules accepted: Orders

## 2021-05-28 ENCOUNTER — Encounter: Payer: 59 | Admitting: Family Medicine

## 2021-05-28 NOTE — Progress Notes (Deleted)
Office Note 05/28/2021  CC: No chief complaint on file.   HPI:  Patient is a 50 y.o. male who is here for annual health maintenance exam and f/u insomnia. I last saw him 8 mo ago. A/P as of that visit: " Insomnia, primary.  Complicated by bilat RC compression/entrapment pain during sleep. No intervention for shoulders indicated at this time.  OK to continue tylenol qhs. We'll do trial of trazodone 50mg , 1-2 qhs prn, #30, rf x 1."  INTERIM HX: ***    Past Medical History:  Diagnosis Date   COVID-19 virus infection 04/19/2020   monoclonal ab infusion   Family history of colon cancer in mother    mother CC 59s:  pt with colonoscopies 2009, 2015, and 2021-->hyperplastic polyps each time.  Recall 07/2024.   History of kidney stones 2017   2017 R ureterolithiasis   Hyperlipidemia    10 yr Frhm = 5% 05/2020-->TLC   Psoriasis    UTI (urinary tract infection)    x 2 in 2013 (renal u/s normal)    Past Surgical History:  Procedure Laterality Date   COLONOSCOPY  11/29/07;12/27/2013; 07/25/19   hypoplastic polyps each time.  Recall 07/2024.   WISDOM TOOTH EXTRACTION      Family History  Problem Relation Age of Onset   Arthritis Mother    Cancer Mother        colon   Colon cancer Mother 6   Colon polyps Mother    Stroke Mother    Colon cancer Maternal Grandmother        not sure age of onset   Stroke Maternal Grandmother    Esophageal cancer Neg Hx    Rectal cancer Neg Hx    Stomach cancer Neg Hx     Social History   Socioeconomic History   Marital status: Married    Spouse name: Not on file   Number of children: Not on file   Years of education: Not on file   Highest education level: Not on file  Occupational History   Occupation: buyer    Comment: Art therapist  Tobacco Use   Smoking status: Never   Smokeless tobacco: Never  Vaping Use   Vaping Use: Never used  Substance and Sexual Activity   Alcohol use: Yes    Comment: rare less than 1 per week    Drug use: No   Sexual activity: Not on file  Other Topics Concern   Not on file  Social History Narrative   Married, 2 children.     Lives and works in [place].   Educ: BA from [employer]   Occup: buyer for [employer]   No T/A/Ds.   Social Determinants of Health   Financial Resource Strain: Not on file  Food Insecurity: Not on file  Transportation Needs: Not on file  Physical Activity: Not on file  Stress: Not on file  Social Connections: Not on file  Intimate Partner Violence: Not on file    Outpatient Medications Prior to Visit  Medication Sig Dispense Refill   betamethasone dipropionate 0.05 % cream Apply topically 2 (two) times daily as needed.     traZODone (DESYREL) 50 MG tablet 1-2 tabs po qhs prn insomnia 30 tablet 1   No facility-administered medications prior to visit.    No Known Allergies  ROS *** PE; Vitals with BMI 09/26/2020 05/28/2020 04/27/2020  Height 6\' 1"  6\' 1"  -  Weight 250 lbs 6 oz 246 lbs 10 oz -  BMI 33.04 32.54 -  Systolic 093 267 124  Diastolic 70 71 92  Pulse 68 59 91   ***  Pertinent labs:  Lab Results  Component Value Date   TSH 1.49 05/28/2020   Lab Results  Component Value Date   WBC 5.5 05/28/2020   HGB 15.2 05/28/2020   HCT 44.6 05/28/2020   MCV 92.6 05/28/2020   PLT 248.0 05/28/2020   Lab Results  Component Value Date   CREATININE 0.99 05/28/2020   BUN 11 05/28/2020   NA 138 05/28/2020   K 3.9 05/28/2020   CL 102 05/28/2020   CO2 29 05/28/2020   Lab Results  Component Value Date   ALT 42 05/28/2020   AST 26 05/28/2020   ALKPHOS 56 05/28/2020   BILITOT 0.7 05/28/2020   Lab Results  Component Value Date   CHOL 209 (H) 05/28/2020   Lab Results  Component Value Date   HDL 41.30 05/28/2020   Lab Results  Component Value Date   LDLCALC 139 (H) 05/28/2020   Lab Results  Component Value Date   TRIG 141.0 05/28/2020   Lab Results  Component Value Date   CHOLHDL 5 05/28/2020   Lab Results  Component Value  Date   PSA 0.66 05/28/2020    ASSESSMENT AND PLAN:   Health maintenance exam: Reviewed age and gender appropriate health maintenance issues (prudent diet, regular exercise, health risks of tobacco and excessive alcohol, use of seatbelts, fire alarms in home, use of sunscreen).  Also reviewed age and gender appropriate health screening as well as vaccine recommendations. Vaccines: Tdap->***.  Flu->***. Labs: fasting HP + PSA. Prostate ca screening: PSA today Colon ca screening: pt on q80yr schedule ([place] CC mother), recall 07/2024.  An After Visit Summary was printed and given to the patient.  FOLLOW UP:  No follow-ups on file.  Signed:  Crissie Sickles, MD           05/28/2021
